# Patient Record
Sex: Male | Born: 1935 | Race: White | Hispanic: No | Marital: Married | Smoking: Never smoker
Health system: Southern US, Community
[De-identification: ages and names within clinical notes are randomized; demographics above are authoritative.]

## PROBLEM LIST (undated history)

## (undated) DIAGNOSIS — I471 Supraventricular tachycardia, unspecified: Secondary | ICD-10-CM

## (undated) DIAGNOSIS — H3530 Unspecified macular degeneration: Secondary | ICD-10-CM

## (undated) DIAGNOSIS — I351 Nonrheumatic aortic (valve) insufficiency: Secondary | ICD-10-CM

## (undated) DIAGNOSIS — I10 Essential (primary) hypertension: Secondary | ICD-10-CM

## (undated) DIAGNOSIS — M1990 Unspecified osteoarthritis, unspecified site: Secondary | ICD-10-CM

## (undated) DIAGNOSIS — C61 Malignant neoplasm of prostate: Secondary | ICD-10-CM

## (undated) HISTORY — DX: Malignant neoplasm of prostate: C61

## (undated) HISTORY — PX: INGUINAL HERNIA REPAIR: SHX194

## (undated) HISTORY — DX: Essential (primary) hypertension: I10

## (undated) HISTORY — PX: PROSTATECTOMY: SHX69

## (undated) HISTORY — PX: OTHER SURGICAL HISTORY: SHX169

## (undated) HISTORY — PX: APPENDECTOMY: SHX54

## (undated) HISTORY — DX: Supraventricular tachycardia, unspecified: I47.10

## (undated) HISTORY — DX: Nonrheumatic aortic (valve) insufficiency: I35.1

## (undated) HISTORY — DX: Unspecified osteoarthritis, unspecified site: M19.90

## (undated) HISTORY — DX: Supraventricular tachycardia: I47.1

## (undated) HISTORY — DX: Unspecified macular degeneration: H35.30

---

## 2004-04-23 ENCOUNTER — Ambulatory Visit (HOSPITAL_COMMUNITY): Admission: RE | Admit: 2004-04-23 | Discharge: 2004-04-23 | Payer: Self-pay | Admitting: Family Medicine

## 2005-02-14 ENCOUNTER — Ambulatory Visit (HOSPITAL_COMMUNITY): Admission: RE | Admit: 2005-02-14 | Discharge: 2005-02-14 | Payer: Self-pay | Admitting: Orthopedic Surgery

## 2006-09-21 ENCOUNTER — Inpatient Hospital Stay (HOSPITAL_COMMUNITY): Admission: RE | Admit: 2006-09-21 | Discharge: 2006-09-24 | Payer: Self-pay | Admitting: Orthopedic Surgery

## 2006-09-25 ENCOUNTER — Encounter: Payer: Self-pay | Admitting: Vascular Surgery

## 2006-09-25 ENCOUNTER — Ambulatory Visit (HOSPITAL_COMMUNITY): Admission: RE | Admit: 2006-09-25 | Discharge: 2006-09-25 | Payer: Self-pay | Admitting: Orthopedic Surgery

## 2006-12-28 ENCOUNTER — Ambulatory Visit (HOSPITAL_COMMUNITY): Admission: RE | Admit: 2006-12-28 | Discharge: 2006-12-28 | Payer: Self-pay | Admitting: Urology

## 2007-03-09 ENCOUNTER — Ambulatory Visit: Admission: RE | Admit: 2007-03-09 | Discharge: 2007-05-26 | Payer: Self-pay | Admitting: Radiation Oncology

## 2007-04-28 ENCOUNTER — Encounter (INDEPENDENT_AMBULATORY_CARE_PROVIDER_SITE_OTHER): Payer: Self-pay | Admitting: Urology

## 2007-04-28 ENCOUNTER — Inpatient Hospital Stay (HOSPITAL_COMMUNITY): Admission: RE | Admit: 2007-04-28 | Discharge: 2007-04-29 | Payer: Self-pay | Admitting: Urology

## 2008-11-08 ENCOUNTER — Ambulatory Visit (HOSPITAL_COMMUNITY): Admission: RE | Admit: 2008-11-08 | Discharge: 2008-11-08 | Payer: Self-pay | Admitting: Orthopedic Surgery

## 2009-05-15 ENCOUNTER — Inpatient Hospital Stay (HOSPITAL_COMMUNITY): Admission: AD | Admit: 2009-05-15 | Discharge: 2009-05-16 | Payer: Self-pay | Admitting: Cardiology

## 2009-05-15 ENCOUNTER — Encounter: Payer: Self-pay | Admitting: Internal Medicine

## 2009-05-18 DIAGNOSIS — C61 Malignant neoplasm of prostate: Secondary | ICD-10-CM

## 2009-05-18 DIAGNOSIS — M109 Gout, unspecified: Secondary | ICD-10-CM

## 2009-05-18 DIAGNOSIS — I10 Essential (primary) hypertension: Secondary | ICD-10-CM

## 2009-05-24 ENCOUNTER — Ambulatory Visit: Payer: Self-pay | Admitting: Internal Medicine

## 2009-05-24 ENCOUNTER — Encounter: Payer: Self-pay | Admitting: Internal Medicine

## 2009-05-24 DIAGNOSIS — I471 Supraventricular tachycardia: Secondary | ICD-10-CM

## 2009-05-31 ENCOUNTER — Telehealth: Payer: Self-pay | Admitting: Internal Medicine

## 2009-06-18 ENCOUNTER — Telehealth: Payer: Self-pay | Admitting: Internal Medicine

## 2009-06-26 ENCOUNTER — Telehealth: Payer: Self-pay | Admitting: Internal Medicine

## 2009-07-30 ENCOUNTER — Ambulatory Visit: Payer: Self-pay | Admitting: Internal Medicine

## 2009-10-01 ENCOUNTER — Telehealth (INDEPENDENT_AMBULATORY_CARE_PROVIDER_SITE_OTHER): Payer: Self-pay | Admitting: *Deleted

## 2009-10-19 ENCOUNTER — Telehealth: Payer: Self-pay | Admitting: Internal Medicine

## 2010-01-17 ENCOUNTER — Ambulatory Visit: Payer: Self-pay | Admitting: Internal Medicine

## 2010-07-25 ENCOUNTER — Ambulatory Visit: Payer: Self-pay | Admitting: Internal Medicine

## 2010-12-10 NOTE — Assessment & Plan Note (Signed)
Summary: 6 month rov/sl   Visit Type:  Follow-up Referring Provider:  Armanda Magic Primary Provider:  Dr.Klein    History of Present Illness: The patient presents today for routine electrophysiology followup. He reports doing very well since last being seen in our clinic. He has had several episodes of "heart racing" lasting 5-10 seconds.  He had 1 episode of abrupt onset tachycardia lasting 15 minutes while helping his daughter move furniture several wks ago.  The patient denies symptoms of chest pain, shortness of breath, orthopnea, PND, lower extremity edema, dizziness, presyncope, syncope, or neurologic sequela. The patient is tolerating medications without difficulties and is otherwise without complaint today.   Current Medications (verified): 1)  Allopurinol 100 Mg Tabs (Allopurinol) .Marland Kitchen.. 1 Tablet Once Daily 2)  Nabumetone 500 Mg Tabs (Nabumetone) .Marland Kitchen.. 1 Tablet Once Daily 3)  Diltiazem Hcl Er Beads 240 Mg Xr24h-Cap (Diltiazem Hcl Er Beads) .... Take One Capsule By Mouth Daily 4)  Aspirin Ec 325 Mg Tbec (Aspirin) .... Take One Tablet By Mouth Daily  Allergies: 1)  ! * No Ivp Dye Allergy 2)  ! * No Shellfish Allergy 3)  ! * No Latex Allergy  Past History:  Past Medical History: Reviewed history from 07/30/2009 and no changes required. prostate cancer status post surgery with subsequent incontinence Hypertension Arthritis Gout Mild aortic insufficiency macular degeneration Glaucoma Hearing loss SVT  Past Surgical History: Reviewed history from 05/24/2009 and no changes required. Bilateral total hip arthroplasty Appendectomy prostatectomy with pelvic lymphadenectomy inguinal herniorrhaphy  Social History: Reviewed history from 05/18/2009 and no changes required. Retired  Married  Tobacco Use - No.  Alcohol Use - yes-2 drinks per day  Vital Signs:  Patient profile:   77 year old male Height:      71 inches Weight:      188 pounds BMI:     26.32 Pulse rate:    48 / minute BP sitting:   112 / 72  (left arm)  Vitals Entered By: Laurance Flatten CMA (January 17, 2010 2:18 PM)  Physical Exam  General:  Well developed, well nourished, in no acute distress. Head:  normocephalic and atraumatic Eyes:  PERRLA/EOM intact; conjunctiva and lids normal. Mouth:  Teeth, gums and palate normal. Oral mucosa normal. Neck:  Neck supple, no JVD. No masses, thyromegaly or abnormal cervical nodes. Lungs:  Clear bilaterally to auscultation and percussion. Heart:  Non-displaced PMI, chest non-tender; regular rate and rhythm, S1, S2 without murmurs, rubs or gallops. Carotid upstroke normal, no bruit. Normal abdominal aortic size, no bruits. Femorals normal pulses, no bruits. Pedals normal pulses. No edema, no varicosities. Abdomen:  Bowel sounds positive; abdomen soft and non-tender without masses, organomegaly, or hernias noted. No hepatosplenomegaly. Msk:  Back normal, normal gait. Muscle strength and tone normal. Pulses:  pulses normal in all 4 extremities Neurologic:  Alert and oriented x 3. Skin:  Intact without lesions or rashes. Psych:  Normal affect.   EKG  Procedure date:  01/17/2010  Findings:      sinus bradycardia 48 bpm, PR 240,  otherwise normal  Impression & Recommendations:  Problem # 1:  PSVT (ICD-427.0) Doing well.   The patient continues to prefer med therapy over ablation. He will contact me should he wish to persue catheter ablation. I think that if he has further prolonged episodes that ablation would be a reasonable options. Continue cardizem vagal maneuvers discussed again today  Patient Instructions: 1)  return in 6 months 2)  contact my office if problems  arise in the interim.

## 2010-12-10 NOTE — Assessment & Plan Note (Signed)
Summary: 6 month rov/sl   Visit Type:  Follow-up Referring Provider:    Primary Provider:      History of Present Illness: The patient presents today for routine electrophysiology followup. He reports doing very well since last being seen in our clinic. He has had several episodes of "heart racing" lasting 5-10 seconds.  He had 1 episode of abrupt onset tachycardia lasting 10 minutes while at rest one wk ago.  The patient denies symptoms of chest pain, shortness of breath, orthopnea, PND, lower extremity edema, dizziness, presyncope, syncope, or neurologic sequela. The patient is tolerating medications without difficulties and is otherwise without complaint today.   Current Medications (verified): 1)  Allopurinol 100 Mg Tabs (Allopurinol) .Marland Kitchen.. 1 Tablet Once Daily 2)  Diltiazem Hcl Er Beads 240 Mg Xr24h-Cap (Diltiazem Hcl Er Beads) .... Take One Capsule By Mouth Daily 3)  Aspirin Ec 325 Mg Tbec (Aspirin) .... Take One Tablet By Mouth Daily  Allergies: 1)  ! * No Ivp Dye Allergy 2)  ! * No Shellfish Allergy 3)  ! * No Latex Allergy  Past History:  Past Medical History: Reviewed history from 07/30/2009 and no changes required. prostate cancer status post surgery with subsequent incontinence Hypertension Arthritis Gout Mild aortic insufficiency macular degeneration Glaucoma Hearing loss SVT  Past Surgical History: Reviewed history from 05/24/2009 and no changes required. Bilateral total hip arthroplasty Appendectomy prostatectomy with pelvic lymphadenectomy inguinal herniorrhaphy  Vital Signs:  Patient profile:   77 year old male Height:      71 inches Weight:      179 pounds BMI:     25.06 Pulse rate:   54 / minute BP sitting:   140 / 70  (left arm)  Vitals Entered By: Laurance Flatten CMA (July 25, 2010 10:58 AM)  Physical Exam  General:  Well developed, well nourished, in no acute distress. Head:  normocephalic and atraumatic Eyes:  PERRLA/EOM intact;  conjunctiva and lids normal. Mouth:  Teeth, gums and palate normal. Oral mucosa normal. Neck:  Neck supple, no JVD. No masses, thyromegaly or abnormal cervical nodes. Lungs:  Clear bilaterally to auscultation and percussion. Heart:  Non-displaced PMI, chest non-tender; regular rate and rhythm, S1, S2 without murmurs, rubs or gallops. Carotid upstroke normal, no bruit. Normal abdominal aortic size, no bruits. Femorals normal pulses, no bruits. Pedals normal pulses. No edema, no varicosities. Abdomen:  Bowel sounds positive; abdomen soft and non-tender without masses, organomegaly, or hernias noted. No hepatosplenomegaly. Msk:  Back normal, normal gait. Muscle strength and tone normal. Pulses:  pulses normal in all 4 extremities Extremities:  No clubbing or cyanosis. Neurologic:  Alert and oriented x 3.   EKG  Procedure date:  07/25/2010  Findings:      sinus bradycardia 54 bpm, PR 220, LAHB  Impression & Recommendations:  Problem # 1:  PSVT (ICD-427.0)  Doing well.   The patient continues to prefer med therapy over ablation. He will contact me should he wish to persue catheter ablation. I think that if he has further prolonged episodes that ablation would be a reasonable options. Continue cardizem vagal maneuvers discussed again today  Problem # 2:  ESSENTIAL HYPERTENSION, BENIGN (ICD-401.1)  Stable, will follow on cardizem. Salt restriction advised  His updated medication list for this problem includes:    Cardizem La 240 Mg Xr24h-tab (Diltiazem hcl coated beads) .Marland Kitchen... 1 by mouth once daily  Patient Instructions: 1)  return in 12 months  Appended Document: 6 month rov/sl   Patient Instructions:  1)  Your physician wants you to follow-up in: 12 months with Dr Jacquiline Doe will receive a reminder letter in the mail two months in advance. If you don't receive a letter, please call our office to schedule the follow-up appointment. Prescriptions: DILTIAZEM HCL ER BEADS 240 MG  XR24H-CAP (DILTIAZEM HCL ER BEADS) Take one capsule by mouth daily  #90 x 3   Entered and Authorized by:   Dennis Bast, RN, BSN   Signed by:   Dennis Bast, RN, BSN on 07/25/2010   Method used:   Faxed to ...       [facility] (mail-order)             , [place]         Ph: [phone]       Fax: [phone]   RxID:   [id number]

## 2010-12-26 ENCOUNTER — Encounter: Payer: Self-pay | Admitting: Internal Medicine

## 2011-02-16 LAB — PROTIME-INR
INR: 1 (ref 0.00–1.49)
Prothrombin Time: 13.6 seconds (ref 11.6–15.2)

## 2011-02-16 LAB — COMPREHENSIVE METABOLIC PANEL
AST: 29 U/L (ref 0–37)
BUN: 30 mg/dL — ABNORMAL HIGH (ref 6–23)
CO2: 29 mEq/L (ref 19–32)
Chloride: 104 mEq/L (ref 96–112)
Creatinine, Ser: 1.29 mg/dL (ref 0.4–1.5)
GFR calc non Af Amer: 54 mL/min — ABNORMAL LOW (ref >60)
Total Bilirubin: 1.3 mg/dL — ABNORMAL HIGH (ref 0.3–1.2)

## 2011-02-16 LAB — LIPID PANEL
HDL: 39 mg/dL — ABNORMAL LOW (ref >39)
VLDL: 8 mg/dL (ref 0–40)

## 2011-02-16 LAB — CBC
HCT: 43.5 % (ref 39.0–52.0)
Hemoglobin: 15 g/dL (ref 13.0–17.0)
MCV: 94.6 fL (ref 78.0–100.0)
RBC: 4.6 MIL/uL (ref 4.22–5.81)
WBC: 7.3 10*3/uL (ref 4.0–10.5)

## 2011-02-16 LAB — CARDIAC PANEL(CRET KIN+CKTOT+MB+TROPI)
CK, MB: 2.4 ng/mL (ref 0.3–4.0)
CK, MB: 2.4 ng/mL (ref 0.3–4.0)
CK, MB: 3.3 ng/mL (ref 0.3–4.0)
Troponin I: 0.05 ng/mL (ref 0.00–0.06)
Troponin I: 0.06 ng/mL (ref 0.00–0.06)
Troponin I: 0.1 ng/mL — ABNORMAL HIGH (ref 0.00–0.06)

## 2011-02-16 LAB — HEPARIN LEVEL (UNFRACTIONATED): Heparin Unfractionated: 0.47 IU/mL (ref 0.30–0.70)

## 2011-03-25 NOTE — Op Note (Signed)
NAMECAEDMON, LOUQUE               ACCOUNT NO.:  [id number]   MEDICAL RECORD NO.:  [id number]          PATIENT TYPE:  INP   LOCATION:  1421                         FACILITY:  [facility]   PHYSICIAN:  Heloise Purpura, MD      DATE OF BIRTH:  February 18, 1932   DATE OF PROCEDURE:  04/28/2007  DATE OF DISCHARGE:                               OPERATIVE REPORT   PREOPERATIVE DIAGNOSIS:  Clinically localized adenocarcinoma of  prostate.   POSTOPERATIVE DIAGNOSIS:  Clinically localized adenocarcinoma of  prostate.   PROCEDURE:  1. Robotic assisted laparoscopic radical prostatectomy (bilateral      nerve sparing).  2. Bilateral pelvic lymphadenectomy.   SURGEON:  Dr. Heloise Purpura.   ASSISTANT:  Dr. Boston Service.   ANESTHESIA:  General.   COMPLICATIONS:  None.   ESTIMATED BLOOD LOSS:  200 mL.   INTRAVENOUS FLUIDS:  1300 mL of lactated Ringer's.   SPECIMENS:  1. Prostate and seminal vesicles.  2. Right pelvic lymph nodes.  3. Left pelvic lymph nodes.   DISPOSITION OF SPECIMENS:  To pathology.   DRAINS:  1. 20-French coude catheter.  2. #19 Blake pelvic drain.   INDICATION:  Mr. Decou is a 77 year old gentleman with clinical  stage T1C prostate cancer with a PSA of 10.3 and Gleason score 3+4 =7.  He underwent a bone scan which demonstrated no evidence of metastatic  disease.  After discussing management options for clinically localized  prostate cancer, the patient elected to proceed with surgical therapy.  Potential risks, complications and alternative options were discussed  with the patient.  Informed consent was obtained.   DESCRIPTION OF PROCEDURE:  The patient was taken to the operating room  and general anesthetic was administered.  He was given preoperative  antibiotics, placed in the dorsal lithotomy position and prepped and  draped in the usual sterile fashion.  During positioning, care was taken  to the avoid excessive traction on the patient's hip due to his  prior  hip replacement surgery.  In addition, he was administered broad-  spectrum IV antibiotics due to his recent hip replacement with Ancef and  ciprofloxacin.  A preoperative time-out was performed.  The Foley  catheter was then inserted into the bladder.  A site was selected just  to the left of midline below the umbilicus for placement of the camera  port.  This was placed using a standard open Hassan technique.  This  allowed entry in the peritoneal cavity under direct vision.  A 12 mm  port was then placed in a pneumoperitoneum was established.  With a 0  degrees lens, the abdomen was inspected.  There was no evidence of any  intra-abdominal injuries or other abnormalities.  The remaining ports  were then placed.  Bilateral 8 mm robotic ports were placed 10 cm  lateral of camera port and just inferior to the camera port.  An  additional 8 mm port was placed in the far left lateral abdominal wall.  A 5 mm port was placed between the camera port and the right robotic  port.  An additional 12 mm port  was placed in the far right lateral  abdominal wall for laparoscopic assistance.  All ports placed under  direct vision without difficulty.  The surgical cart was then docked.  With the aid of cautery scissors, the bladder was reflected posteriorly  allowing entry into the space of Retzius and identification of the  endopelvic fascia and prostate.  The endopelvic fascia was then incised  from the apex back to the base of the prostate bilaterally and the  underlying levator muscle fibers were swept laterally off the prostate.  This isolated the dorsal venous complex which was then stapled and  divided with a 45 mm Flex ETS stapler.  The bladder neck was identified  with the aid of Foley catheter manipulation and was divided anteriorly.  This exposed the Foley catheter.  The catheter balloon was then deflated  and the catheter was brought into the operative field and used to  retract the  prostate anteriorly.  The posterior bladder neck was then  divided and dissection continued between the bladder neck and prostate  until the vasa deferentia and seminal vesicles were identified.  The  vasa deferentia were isolated and divided and then lifted anteriorly.  The seminal vesicles were dissected down to their tips with care to  control the seminal vesicle arterial blood supply and then lifted  anteriorly.  The space between Denonvilliers' fascia and the anterior  rectum was then bluntly developed thereby isolating the vascular  pedicles of the prostate.  The lateral prostatic fascia was then incised  bilaterally allowing the neurovascular bundles to be swept laterally and  posteriorly off the prostate.  The prostatic vascular pedicles were then  ligated with Hem-o-lok clips and divided with sharp cold scissor  dissection.  The nerve neurovascular loss were swept off the apex of the  prostate and urethra and the urethra was sharply divided allowing the  prostate specimen to be disarticulated.  The pelvis was then copiously  irrigated and hemostasis was ensured.  There is noted to be a small  arterial bleeding site off the left neurovascular bundle which was  controlled with a figure-of-eight 3-0 Vicryl suture.  Care was taken to  preserve the neurovascular bundle.  There was no evidence of a rectal  injury.  Attention turned to the right pelvic sidewall.  The fibrofatty  tissue between the external iliac vein, confluence of the iliac vessels,  internal iliac artery, and Cooper's ligament was dissected free from the  pelvic sidewall with care to preserve the obturator nerve.  Hem-o-lok  clips were used for lymphostasis and hemostasis.  This specimen was then  passed off for permanent pathologic analysis.  An identical procedure  was then performed on the contralateral side.  Attention then turned to  the urethral anastomosis.  A 3-0 Vicryl suture was used to  reapproximate Denonvilliers' fascia to the posterior urethral tissue.  A 2-0 Vicryl  slip-knot was then placed at the 6 o'clock position to reapproximate the  bladder neck and urethra.  A double-armed 3-0 Monocryl suture was then  used to perform a 360 degrees running tension-free anastomosis between  the bladder neck and urethra.  A new 20-French coude catheter was  inserted into the bladder and irrigated.  There no blood clots within  the bladder and the anastomosis appeared to be watertight.  A #19 Blake  drain was then brought through the left robotic port and appropriately  positioned in the pelvis.  It was secured to skin with a nylon suture.  The surgical cart was then undocked.  A 0-0 Vicryl suture was then used  to close the right lateral 12 mm port site with the suture passer  device.  The prostate specimen was removed intact within the Endopouch  retrieval bag via the periumbilical port site.  This fascial opening was  closed with a running 0-0 Vicryl suture.  All remaining ports were  removed under direct vision.  All port sites were injected with quarter percent Marcaine and  reapproximated at the skin level with staples.  Sterile dressings were  applied.  The patient appeared to tolerate the procedure well and  without complications.  He was able to be extubated and transferred to  recovery in satisfactory condition.           ______________________________  Heloise Purpura, MD  Electronically Signed     LB/MEDQ  D:  04/28/2007  T:  04/28/2007  Job:  [id number]

## 2011-03-25 NOTE — Discharge Summary (Signed)
NAMESTEEN, BISIG               ACCOUNT NO.:  [id number]   MEDICAL RECORD NO.:  [id number]          PATIENT TYPE:  INP   LOCATION:  1421                         [facility]AC[facility]   PHYSICIAN:  Heloise Purpura, MD      DATE OF BIRTH:  [birth year]   DATE OF ADMISSION:  04/28/2007  DATE OF DISCHARGE:  04/29/2007                               DISCHARGE SUMMARY   ADMISSION DIAGNOSIS:  Prostate cancer.   DISCHARGE DIAGNOSIS:  Prostate cancer.   PROCEDURES:  1. Robotic-assisted laparoscopic radical prostatectomy.  2. Bilateral pelvic lymphadenectomy.   HISTORY AND PHYSICAL:  For full details please see admission history and  physical.  Briefly, Mr. Eakins is a 77 year old gentleman with  clinical stage T1c prostate cancer with a PSA of 10.3 and Gleason score  3 + 4 = 7.  After discussing management options for clinically localized  prostate cancer, he elected to proceed with surgical therapy and the  above procedure.   HOSPITAL COURSE:  The patient was taken to the operating room on April 28, 2007, and underwent a robotic-assisted laparoscopic radical  prostatectomy and bilateral pelvic lymphadenectomy.  He tolerated this  procedure well and without complications.  Postoperatively, he was able  to be transferred to a regular hospital room following recovery from  anesthesia.  He was able to begin ambulating the night of surgery and on  postoperative day #1, remained hemodynamically stable with a stable  hemoglobin of 12.5.  He maintained excellent urine output with minimal  output from his pelvic drain.  His pelvic drain was therefore removed.  He was begun on a clear liquid diet which he tolerated and was  therefore, able to be transitioned to oral pain medication.  By the  afternoon of postoperative day #1, he had met all discharge criteria was  able to be discharged home in excellent condition.   DISPOSITION:  Home.   DISCHARGE MEDICATIONS:  The patient was instructed to resume  all of his  regular home medications excepting any aspirin, nonsteroidal anti-  inflammatory drugs, or herbal supplements.  He was given a prescription  to take Vicodin as needed for pain, told to use Colace as a stool  softener, and given a prescription to begin Cipro 1 day prior his return  visit for Foley catheter removal.   DISCHARGE INSTRUCTIONS:  1. The patient was instructed to be ambulatory but specifically told      to refrain from any heavy lifting, strenuous activity, or driving.  2. He was told to gradually advance his diet once passing flatus.  3. He was also instructed on routine Foley catheter care.   FOLLOWUP:  Mr. Bracco will follow up in 1 week for removal of his  Foley catheter and to discuss his surgical pathology in detail.           ______________________________  Heloise Purpura, MD  Electronically Signed     LB/MEDQ  D:  04/29/2007  T:  04/29/2007  Job:  [id number]

## 2011-03-25 NOTE — H&P (Signed)
[name], [name]               ACCOUNT NO.:  [id number]   MEDICAL RECORD NO.:  [id number]          PATIENT TYPE:  INP   LOCATION:  X003                         [facility]AC[facility]  [facility]   PHYSICIAN:  Heloise Purpura, MD      DATE OF BIRTH:  21-Aug-1932   DATE OF ADMISSION:  04/28/2007  DATE OF DISCHARGE:                              HISTORY & PHYSICAL   CHIEF COMPLAINT:  Prostate cancer.   HISTORY:  Mr. Boorman is a 77 year old gentleman with clinical stage  T1C prostate cancer with a PSA of 10.3 and Gleason score 3+4 equals 7.  He was initially managed with active surveillance when he was initially  diagnosed and 2006 with a small volume Gleason 6 cancer.  He did  demonstrate progression an on repeat biopsy had the above parameters.  He underwent a bone scan which was negative for metastatic disease.  After discussing management options for clinically localized prostate  cancer, the patient elected to proceed with surgical therapy.  Due to  his history of mild aortic insufficiency, the patient did see Dr. Armanda Magic for cardiac clearance and was felt to be a low risk for surgery.  He was instructed to continue his beta blocker.   PAST MEDICAL HISTORY:  1. Hypertension.  2. Arthritis.  3. Gout.  4. Mild aortic insufficiency.   PAST SURGICAL HISTORY:  1. Bilateral total hip arthroplasty.  2. Appendectomy.   MEDICATIONS:  1. Aspirin.  2. Allopurinol.  3. Toprol.   ALLERGIES:  No known drug allergies.   FAMILY HISTORY:  No history of prostate cancer or GU malignancy.   SOCIAL HISTORY:  The patient is retired.  He is married.  He denies  tobacco use.  He drinks approximately two glasses of alcohol per day.   REVIEW OF SYSTEMS:  Complete Review of Systems was performed.  Pertinent  positives include blurred vision and joint pain.   PHYSICAL EXAMINATION:  CONSTITUTIONAL:  Well-nourished, well-developed,  age-appropriate male in no acute distress.  CARDIOVASCULAR:  Regular  rate and rhythm.  LUNGS:  Clear bilaterally.  RECTAL:  No nodularity or induration.   IMPRESSION:  Clinically localized prostate cancer.   PLAN:  Mr. Pustejovsky will undergo robotic-assisted laparoscopic radical  prostatectomy and bilateral pelvic lymphadenectomy.  He will then be  admitted to the hospital for routine postoperative care.           ______________________________  Heloise Purpura, MD  Electronically Signed     LB/MEDQ  D:  04/28/2007  T:  04/28/2007  Job:  [id number]

## 2011-03-28 NOTE — Op Note (Signed)
NAMETYKE, OUTMAN               ACCOUNT NO.:  [id number]   MEDICAL RECORD NO.:  [id number]          PATIENT TYPE:  INP   LOCATION:  2899                         FACILITY:  [facility]   PHYSICIAN:  Feliberto Gottron. Turner Daniels, M.D.   DATE OF BIRTH:  Aug 10, 1932   DATE OF PROCEDURE:  09/21/2006  DATE OF DISCHARGE:                                 OPERATIVE REPORT   PREOPERATIVE DIAGNOSIS:  End-stage arthritis left hip.   POSTOPERATIVE DIAGNOSIS:  End-stage arthritis left hip.   PROCEDURE:  Left total hip arthroplasty using DePuy 54-mm ASR cup, NK+0 47  mm ultimate head, 18 x 13 x 150 x-ray 42 SROM stem, 18 18 F small cone.   SURGEON:  Feliberto Gottron. Turner Daniels, M.D.   ASSISTANT:  Skip Mayer PA-C.   ANESTHETIC:  General endotracheal.   ESTIMATED BLOOD LOSS:  700 mL.   FLUID REPLACEMENT:  1500 mL crystalloid.   DRAINS PLACED:  Foley catheter.   URINE OUTPUT:  400 mL.   INDICATIONS FOR PROCEDURE:  A 77 year old man who has had a total hip and a  revision total hip on the right side done by other physicians.  Was end-  stage arthritis of the left hip and desires elective left total hip  arthroplasty.  He is well aware of the risks and benefits of surgery and has  failed conservative treatment.   DESCRIPTION OF PROCEDURE:  The patient was identified by armband, taken to  the operating room at [facility]  Appropriate anesthetic monitors  were attached and general endotracheal anesthesia induced with the patient  in the supine position.  Foley catheter inserted.  He was then placed in the  right lateral decubitus position and fixed there with a Veronda Prude II  pelvic clamp and the left lower extremity prepped and draped in the usual  sterile fashion from the ankle to the hemipelvis.  Skin along the lateral  hip and thigh infiltrated with 10 mL of half percent Marcaine and  epinephrine solution.  A 14 cm incision centered over the greater trochanter  allowing a posterolateral approach to the  hip was then made through the skin  and subcutaneous tissue down to the level of the tensor fascia lata.  An IT  band which was cut in line with the skin incision exposing the greater  trochanter.  Cobra retractor was then placed between the superior hip joint  capsule and the gluteus minimus and a second between the quadratus femoris  and the inferior hip joint capsule.  The short external rotators were  isolated and cut off their insertion on either trochanteric crest and tagged  with a #2 Ethilon suture, allowing [place] to develop an acetabular-based  capsular flap going posterior-superior to posterior-inferior.  This was also  tagged with two #2 Ethibond sutures.  The hip was then flexed and  internally rotated, dislocating the joint and exposing the arthritic femoral  head which was then cut off one fingerbreadth above the lesser trochanter.  The hip was then placed in neutral rotation and then translocated anteriorly  proximally using a Homan retractor, levering off  the anterior column.  A  spiked Hohmann was placed in the obturator foramen inferiorly and posterior-  superior and posterior-inferior wing retractors were placed allowing good  exposure of the acetabulum.  __________ was then performed with the  electrocautery and we sequentially reamed the acetabulum up to a 53 mm  basket reamer obtaining good coverage in all quadrants and irrigated out the  acetabulum with normal saline solution.  A 54 mm ASR shell was then loaded  on the inserter and hammered into place in 45 degrees of abduction and 20  degrees of anteversion, obtaining good fit and fill.   At this point, the hip was flexed and internally rotated exposing the  proximal femur which was entered with a box cutting chisel followed by the  initiator and followed by axial reaming up to a 13.5-mm axial reamer to the  appropriate depth for the SROM system.  We then reamed the proximal femur up  to an 35 F cone followed by calcar  milling to a small calcar and inserted an  18 F small cone followed by the 18 trial stem with a 42 base neck, NK+0 47  mm trial head.  The hip was then reduced, could not be dislocated anteriorly  in external rotation and required 90 of flexion and almost 90 of internal  rotation before it became unstable posteriorly.  At this point, the trial  components were removed.  The proximal femur was irrigated out with normal  saline solution and we hammered into place an 58 F small cone followed by an  18 x 150 x 13 x 42 stem in the same version as the calcar.  An NK+0 47 mm  ultimate ball was then hammered onto the stem.  The hip again reduced and  checked for stability and found to be excellent.  The wound was irrigated  out with normal saline solution.  The short external rotators were repaired  back to the intertrochanteric crest through drill holes.  The IT band closed  with running #1 Vicryl suture.  The subcutaneous tissue with zero and 2-0  undyed Vicryl suture and the skin with running interlocking 3-0 nylon.  A  dressing of Xeroform, 4x4 dressing sponges and Hypafix tape applied.  The  patient was then unclamped, laid supine, awakened and taken recovery room  without difficulty.      Feliberto Gottron. Turner Daniels, M.D.  Electronically Signed     FJR/MEDQ  D:  09/21/2006  T:  09/21/2006  Job:  [id number]

## 2011-03-28 NOTE — Discharge Summary (Signed)
NAMEAVIER, [name]               ACCOUNT NO.:  [id number]   MEDICAL RECORD NO.:  [id number]          PATIENT TYPE:  INP   LOCATION:  5028                         FACILITY:  [facility]   PHYSICIAN:  Feliberto Gottron. Turner Daniels, M.D.   DATE OF BIRTH:  [birth year]   DATE OF ADMISSION:  09/21/2006  DATE OF DISCHARGE:  09/24/2006                               DISCHARGE SUMMARY   PRIMARY DIAGNOSIS FOR THIS ADMISSION:  End-stage degenerative joint  disease of the left hip.   PROCEDURE WHILE IN HOSPITAL:  Left total hip arthroplasty.   DISCHARGE SUMMARY:  Patient is a 77 year old man who has had a total hip  revision and total hip on the right side done by the physicians.  He has  had end-stage arthritis of the left hip for several years and desires  elective total left hip arthroplasty.  He is well aware of the risks  versus benefits having failed conservative treatment.   ALLERGIES:  NO KNOWN DRUG ALLERGIES   MEDICATIONS AT TIME OF ADMISSION:  Aspirin, allopurinol, and metoprolol.   PAST HISTORY:  1. Heart palpitations.  2. Increased blood pressure.  3. DJD.  4. Gout.   SURGICAL HISTORY:  1. Right total hip arthroplasty in 1988.  2. Right total hip revision in 2006.  3. Appendectomy in 1945, no difficulties with [facility]EC.   SOCIAL HISTORY:  No tobacco, positive ethanol occasionally.  No IV drug  use, he is married and retired.   FAMILY HISTORY:  Mother died at the age of 21 due to cancer.  Father  died at 80, also cancer.   REVIEW OF SYSTEMS:  __________ with normal heartbeat and calf cramps.  No history of shortness of breath, chest pain, or recent illness.   EXAM:  VITAL SIGNS:  Patient's temperature is 98.5, pulse 72,  respirations 16, blood pressure 135/85, approximately a 190 pound male.  HEENT:  Head is normocephalic, atraumatic.  Ears:  TMs clear.  Eyes:  Pupils equal, round, reactive to light and accommodation.  Nose:  Patent.  Throat benign.  NECK:  Supple.  Full range of motion.  CHEST:  Clear to auscultation.  HEART:  Regular rate and rhythm.  ABDOMEN:  Soft, nontender.  EXTREMITIES:  Right hip has full range of motion.  Left hip, positive  pain with range of motion and internal and external rotation 5 degrees.  Left ankle is swollen with positive pain.  SKIN:  Showed a well-healed scar of right tip.   LABORATORY DATA:  X-rays show end-stage DJD changes of the left hip.   Preoperative labs, including CBC, CMT, chest x-ray, EKG, PT and PTT were  all within normal limits with the exception of a WBC of 11, glucose of  106, bilirubin of 1.5.   HOSPITAL COURSE:  On the day of admission, the patient was taken to the  operating room at [facility] where he underwent a left total  hip arthroplasty using S-ROM components, 18 x 13 x 150 x 42 S-ROM stem,  with an 18-F small cone, NK +0 47-mm ultima head and a 54-mm ASR cup. A  Foley catheter was placed preoperatively.  The patient was placed on  preoperative antibiotics.  He was placed on postoperative Coumadin  prophylaxis with Lovenox to bridge until he became therapeutic.   Postoperative day 1:  The patient was awake and alert, trace nausea, no  actual emesis, vital signs are stable, he is afebrile.  Urine output  1000 cc, PT of 13.2, hemoglobin 10.4.  He will maintain weightbearing is  tolerated, ambulation still had precautions.   Postoperative day 2:  The patient was awake and alert, in moderate pain,  no nausea or vomiting, vital signs are stable, he was afebrile.  Dressing was dry, neurovascular intact.  Hemoglobin 10.2, PT 16.2,  otherwise stable and making good progress with physical therapy.   Postoperative day 3:  Patient was without complaint.  He was afebrile.  Hemoglobin 9.3, INR 1.4.  He was discharged home after passing physical  therapy goals.   Medication at time of discharge include Tylox,  __________ protocol [facility] 2 weeks postoperatively and target INR of 1.5 to   2, Robaxin as needed for spasm, resume home meds as per home med drug  sheet, dressing changes daily, diet is regular, activity weightbearing  as tolerated with total hip precautions.  Return to clinic in 1 week's  time per Dr. Turner Daniels for a followup check.      [name]. [name].      Feliberto Gottron. Turner Daniels, M.D.  Electronically Signed    JBR/MEDQ  D:  10/19/2006  T:  10/20/2006  Job:  [id number]

## 2011-06-30 ENCOUNTER — Encounter: Payer: Self-pay | Admitting: Internal Medicine

## 2011-07-21 ENCOUNTER — Ambulatory Visit (INDEPENDENT_AMBULATORY_CARE_PROVIDER_SITE_OTHER): Payer: Self-pay | Admitting: Internal Medicine

## 2011-07-21 ENCOUNTER — Encounter: Payer: Self-pay | Admitting: Internal Medicine

## 2011-07-21 VITALS — BP 122/66 | HR 50 | Ht 71.0 in | Wt 186.8 lb

## 2011-07-21 DIAGNOSIS — I471 Supraventricular tachycardia: Secondary | ICD-10-CM

## 2011-07-21 DIAGNOSIS — I10 Essential (primary) hypertension: Secondary | ICD-10-CM

## 2011-07-21 NOTE — Assessment & Plan Note (Signed)
Controlled with diltiazem No changes

## 2011-07-21 NOTE — Assessment & Plan Note (Signed)
Stable No change required today  

## 2011-07-21 NOTE — Progress Notes (Signed)
The patient presents today for routine electrophysiology followup.  Since last being seen in our clinic, the patient reports doing very well.  He remains very active.  He has had no SVT over the past year. Today, he denies symptoms of palpitations, chest pain, shortness of breath, orthopnea, PND, lower extremity edema, dizziness, presyncope, syncope, or neurologic sequela.  The patient feels that he is tolerating medications without difficulties and is otherwise without complaint today.   Past Medical History  Diagnosis Date  . Prostate cancer     s/p surgery with subsequent incontinence  . HTN (hypertension)   . Arthritis   . Gout   . Aortic insufficiency     mild  . Macular degeneration   . Glaucoma   . Hearing loss   . SVT (supraventricular tachycardia)    Past Surgical History  Procedure Date  . Bilateral total hip replacement   . Appendectomy   . Prostatectomy     with pelvic lymphadenectomy  . Inguinal hernia repair     Current Outpatient Prescriptions  Medication Sig Dispense Refill  . allopurinol (ZYLOPRIM) 100 MG tablet Take 100 mg by mouth daily.        Marland Kitchen aspirin EC 325 MG tablet Take 325 mg by mouth daily.        . diclofenac (VOLTAREN) 75 MG EC tablet Take 75 mg by mouth 2 (two) times daily.        Marland Kitchen diltiazem (TIAZAC) 240 MG 24 hr capsule Take 240 mg by mouth daily.          Allergies no known allergies  History   Social History  . Marital Status: Married    Spouse Name: N/A    Number of Children: N/A  . Years of Education: N/A   Occupational History  . Retired    Social History Main Topics  . Smoking status: Never Smoker   . Smokeless tobacco: Not on file  . Alcohol Use: Yes     2 drinks per day  . Drug Use: Not on file  . Sexually Active: Not on file   Other Topics Concern  . Not on file   Social History Narrative  . No narrative on file    Family History  Problem Relation Age of Onset  . Cancer Mother   . Cancer Father    Physical  Exam: Filed Vitals:   07/21/11 1117  BP: 122/66  Pulse: 50  Height: 5\' 11"  (1.803 m)  Weight: 186 lb 12.8 oz (84.732 kg)    GEN- The patient is well appearing, alert and oriented x 3 today.   Head- normocephalic, atraumatic Eyes-  Sclera clear, conjunctiva pink Ears- hearing intact Oropharynx- clear Neck- supple, no JVP Lymph- no cervical lymphadenopathy Lungs- Clear to ausculation bilaterally, normal work of breathing Heart- Regular rate and rhythm, no murmurs, rubs or gallops, PMI not laterally displaced GI- soft, NT, ND, + BS Extremities- no clubbing, cyanosis, or edema MS- no significant deformity or atrophy Skin- no rash or lesion Psych- euthymic mood, full affect Neuro- strength and sensation are intact  ekg today reveals sinus bradycardia 50 bpm, PR 218, LVH, LAHB  Assessment and Plan:

## 2011-07-21 NOTE — Patient Instructions (Signed)
Your physician wants you to follow-up in: 12 months with Dr Allred You will receive a reminder letter in the mail two months in advance. If you don't receive a letter, please call our office to schedule the follow-up appointment.  

## 2011-08-03 ENCOUNTER — Other Ambulatory Visit: Payer: Self-pay | Admitting: Internal Medicine

## 2011-08-27 LAB — POTASSIUM: Potassium: 4

## 2011-08-27 LAB — HEMOGLOBIN AND HEMATOCRIT, BLOOD
HCT: 36.5 — ABNORMAL LOW
HCT: 37.5 — ABNORMAL LOW
Hemoglobin: 12.5 — ABNORMAL LOW
Hemoglobin: 12.5 — ABNORMAL LOW

## 2011-08-27 LAB — TYPE AND SCREEN
ABO/RH(D): A POS
Antibody Screen: NEGATIVE

## 2011-08-27 LAB — ABO/RH: ABO/RH(D): A POS

## 2011-08-28 LAB — BASIC METABOLIC PANEL
CO2: 29
Calcium: 9.8
Chloride: 106
Creatinine, Ser: 1.1
GFR calc Af Amer: >60
Sodium: 142

## 2011-08-28 LAB — CBC
Hemoglobin: 14
MCHC: 33.6
MCV: 95.5
RBC: 4.38
WBC: 9.5

## 2012-04-28 ENCOUNTER — Other Ambulatory Visit: Payer: Self-pay | Admitting: *Deleted

## 2012-04-28 MED ORDER — DILTIAZEM HCL ER BEADS 240 MG PO CP24: 240.0000 mg | ORAL_CAPSULE | Freq: Every day | ORAL | Status: DC

## 2012-08-09 ENCOUNTER — Ambulatory Visit (INDEPENDENT_AMBULATORY_CARE_PROVIDER_SITE_OTHER): Payer: MEDICARE | Admitting: Internal Medicine

## 2012-08-09 ENCOUNTER — Encounter: Payer: Self-pay | Admitting: Internal Medicine

## 2012-08-09 VITALS — BP 134/73 | HR 53 | Ht 71.0 in | Wt 187.0 lb

## 2012-08-09 DIAGNOSIS — I471 Supraventricular tachycardia: Secondary | ICD-10-CM

## 2012-08-09 NOTE — Progress Notes (Signed)
PCP: Catha Gosselin, MD Primary Cardiologist: [name] is a 78 y.o. male who presents today for routine electrophysiology followup.  Since last being seen in our clinic, the patient reports doing very well.  He has had no SVT in over 2 years.  Today, he denies symptoms of palpitations, chest pain, shortness of breath,  lower extremity edema, dizziness, presyncope, or syncope.  He remains very active and continues to do yard work without difficulty.  The patient is otherwise without complaint today.   Past Medical History  Diagnosis Date  . Prostate cancer     s/p surgery with subsequent incontinence  . HTN (hypertension)   . Arthritis   . Gout   . Aortic insufficiency     mild  . Macular degeneration   . Glaucoma   . Hearing loss   . SVT (supraventricular tachycardia)    Past Surgical History  Procedure Date  . Bilateral total hip replacement   . Appendectomy   . Prostatectomy     with pelvic lymphadenectomy  . Inguinal hernia repair     Current Outpatient Prescriptions  Medication Sig Dispense Refill  . allopurinol (ZYLOPRIM) 100 MG tablet Take 100 mg by mouth daily.        Marland Kitchen aspirin EC 325 MG tablet Take 325 mg by mouth daily.        . diclofenac (VOLTAREN) 75 MG EC tablet Take 75 mg by mouth 2 (two) times daily.        Marland Kitchen diltiazem (TIAZAC) 240 MG 24 hr capsule Take 1 capsule (240 mg total) by mouth daily.  90 capsule  2    Physical Exam: Filed Vitals:   08/09/12 1000  BP: 134/73  Pulse: 53  Height: 5\' 11"  (1.803 m)  Weight: 187 lb (84.823 kg)  SpO2: 100%    GEN- The patient is well appearing, alert and oriented x 3 today.   Head- normocephalic, atraumatic Eyes-  Sclera clear, conjunctiva pink Ears- hearing intact Oropharynx- clear Lungs- Clear to ausculation bilaterally, normal work of breathing Heart- Regular rate and rhythm, no murmurs, rubs or gallops, PMI not laterally displaced GI- soft, NT, ND, + BS Extremities- no clubbing,  cyanosis, or edema  ekg today reveals sinus rhythm 53 bpm, PR 200 msec, LVH  Assessment and Plan:

## 2012-08-09 NOTE — Assessment & Plan Note (Signed)
No arrhythmias in over two years Continue diltiazem No changes  Return in 12 months

## 2012-08-09 NOTE — Patient Instructions (Addendum)
Your physician wants you to follow-up in: 12 months with Dr Allred You will receive a reminder letter in the mail two months in advance. If you don't receive a letter, please call our office to schedule the follow-up appointment.  

## 2013-02-07 ENCOUNTER — Other Ambulatory Visit: Payer: Self-pay | Admitting: *Deleted

## 2013-02-07 ENCOUNTER — Other Ambulatory Visit: Payer: Self-pay | Admitting: Physical Medicine and Rehabilitation

## 2013-02-07 DIAGNOSIS — M48061 Spinal stenosis, lumbar region without neurogenic claudication: Secondary | ICD-10-CM

## 2013-02-07 DIAGNOSIS — M545 Low back pain: Secondary | ICD-10-CM

## 2013-02-07 MED ORDER — DILTIAZEM HCL ER BEADS 240 MG PO CP24: 240.0000 mg | ORAL_CAPSULE | Freq: Every day | ORAL | Status: DC

## 2013-02-12 ENCOUNTER — Ambulatory Visit
Admission: RE | Admit: 2013-02-12 | Discharge: 2013-02-12 | Disposition: A | Discharge status: Home / Self Care | Payer: Medicare Other | Source: Ambulatory Visit | Attending: Physical Medicine and Rehabilitation | Admitting: Physical Medicine and Rehabilitation

## 2013-02-12 DIAGNOSIS — M48061 Spinal stenosis, lumbar region without neurogenic claudication: Secondary | ICD-10-CM

## 2013-02-12 DIAGNOSIS — M545 Low back pain: Secondary | ICD-10-CM

## 2013-02-23 ENCOUNTER — Emergency Department (HOSPITAL_COMMUNITY)
Admission: EM | Admit: 2013-02-23 | Discharge: 2013-02-23 | Disposition: A | Discharge status: Home / Self Care | Payer: Medicare Other | Attending: Emergency Medicine | Admitting: Emergency Medicine

## 2013-02-23 ENCOUNTER — Emergency Department (HOSPITAL_COMMUNITY): Payer: Medicare Other

## 2013-02-23 ENCOUNTER — Encounter (HOSPITAL_COMMUNITY): Payer: Self-pay | Admitting: *Deleted

## 2013-02-23 DIAGNOSIS — Z8679 Personal history of other diseases of the circulatory system: Secondary | ICD-10-CM | POA: Insufficient documentation

## 2013-02-23 DIAGNOSIS — M129 Arthropathy, unspecified: Secondary | ICD-10-CM | POA: Insufficient documentation

## 2013-02-23 DIAGNOSIS — N200 Calculus of kidney: Secondary | ICD-10-CM | POA: Insufficient documentation

## 2013-02-23 DIAGNOSIS — Z8546 Personal history of malignant neoplasm of prostate: Secondary | ICD-10-CM | POA: Insufficient documentation

## 2013-02-23 DIAGNOSIS — Z79899 Other long term (current) drug therapy: Secondary | ICD-10-CM | POA: Insufficient documentation

## 2013-02-23 DIAGNOSIS — H9190 Unspecified hearing loss, unspecified ear: Secondary | ICD-10-CM | POA: Insufficient documentation

## 2013-02-23 DIAGNOSIS — Z7982 Long term (current) use of aspirin: Secondary | ICD-10-CM | POA: Insufficient documentation

## 2013-02-23 DIAGNOSIS — Z8669 Personal history of other diseases of the nervous system and sense organs: Secondary | ICD-10-CM | POA: Insufficient documentation

## 2013-02-23 DIAGNOSIS — Z96649 Presence of unspecified artificial hip joint: Secondary | ICD-10-CM | POA: Insufficient documentation

## 2013-02-23 DIAGNOSIS — M109 Gout, unspecified: Secondary | ICD-10-CM | POA: Insufficient documentation

## 2013-02-23 DIAGNOSIS — I10 Essential (primary) hypertension: Secondary | ICD-10-CM | POA: Insufficient documentation

## 2013-02-23 LAB — BASIC METABOLIC PANEL
CO2: 26 mEq/L (ref 19–32)
Chloride: 103 mEq/L (ref 96–112)
GFR calc non Af Amer: 58 mL/min — ABNORMAL LOW (ref >90)
Glucose, Bld: 118 mg/dL — ABNORMAL HIGH (ref 70–99)
Potassium: 4.4 mEq/L (ref 3.5–5.1)
Sodium: 137 mEq/L (ref 135–145)

## 2013-02-23 LAB — CBC WITH DIFFERENTIAL/PLATELET
Eosinophils Absolute: 0.2 10*3/uL (ref 0.0–0.7)
Hemoglobin: 13.6 g/dL (ref 13.0–17.0)
Lymphocytes Relative: 11 % — ABNORMAL LOW (ref 12–46)
Lymphs Abs: 0.9 10*3/uL (ref 0.7–4.0)
Monocytes Relative: 7 % (ref 3–12)
Neutro Abs: 6.7 10*3/uL (ref 1.7–7.7)
Neutrophils Relative %: 80 % — ABNORMAL HIGH (ref 43–77)
Platelets: 237 10*3/uL (ref 150–400)
RBC: 4.36 MIL/uL (ref 4.22–5.81)
WBC: 8.4 10*3/uL (ref 4.0–10.5)

## 2013-02-23 LAB — URINE MICROSCOPIC-ADD ON

## 2013-02-23 LAB — URINALYSIS, ROUTINE W REFLEX MICROSCOPIC
Glucose, UA: NEGATIVE mg/dL
Leukocytes, UA: NEGATIVE
pH: 5.5 (ref 5.0–8.0)

## 2013-02-23 MED ORDER — ONDANSETRON HCL 4 MG PO TABS: 4.0000 mg | ORAL_TABLET | Freq: Three times a day (TID) | ORAL | Status: DC | PRN

## 2013-02-23 MED ORDER — HYDROCODONE-ACETAMINOPHEN 5-325 MG PO TABS: ORAL_TABLET | ORAL | Status: DC

## 2013-02-23 NOTE — ED Notes (Signed)
Pt states this evening after dinner started having L sided flank pain, went to bed and awoke w/ pain worse, states also feels like he needs to urinate but cannot, states does have incontinence.

## 2013-02-23 NOTE — ED Provider Notes (Signed)
History     CSN: [id number]  Arrival date & time 02/23/13  0059   First MD Initiated Contact with Patient 02/23/13 0120      Chief Complaint  Patient presents with  . Flank Pain    (Consider location/radiation/quality/duration/timing/severity/associated sxs/prior treatment) Patient is a 79 y.o. male presenting with flank pain.  Flank Pain   79 year old male presents to emergency room with complaint of left flank pain and the feeling that he needs to urinate.  He reports he has a history of back pain, had been on NSAIDs in the past.  NSAIDs were DC'd by his PCP who was worried about kidney damage.  He reports he now has every day, low-grade back pain.  Tonight, around 11:00, he had a different sort of back pain, more severe than his normal.  He reports he has had some radiation of pain down into his left groin.  Patient reports he has a history of prostate cancer, after surgery has had persistent incontinence.  He normally does not have the urge to urinate, but since the onset of this left flank pain, has had several episodes where he feels the urge to go.  He denies seeing any blood in his urine.  He reports he's otherwise fairly healthy.  No prior history of kidney stones.  He is followed [facility] for his prostate cancer.  Past Medical History  Diagnosis Date  . Prostate cancer     s/p surgery with subsequent incontinence  . HTN (hypertension)   . Arthritis   . Gout   . Aortic insufficiency     mild  . Macular degeneration   . Glaucoma(365)   . Hearing loss   . SVT (supraventricular tachycardia)     Past Surgical History  Procedure Laterality Date  . Bilateral total hip replacement    . Appendectomy    . Prostatectomy      with pelvic lymphadenectomy  . Inguinal hernia repair      Family History  Problem Relation Age of Onset  . Cancer Mother   . Cancer Father     History  Substance Use Topics  . Smoking status: Never Smoker   . Smokeless tobacco: Not on  file  . Alcohol Use: Yes     Comment: 2 drinks per day      Review of Systems  Genitourinary: Positive for flank pain.   See History of Present Illness; otherwise all other systems are reviewed and negative  Allergies  Review of patient's allergies indicates no known allergies.  Home Medications   Current Outpatient Rx  Name  Route  Sig  Dispense  Refill  . allopurinol (ZYLOPRIM) 100 MG tablet   Oral   Take 100 mg by mouth daily.           Marland Kitchen aspirin EC 325 MG tablet   Oral   Take 325 mg by mouth daily.           . diclofenac (VOLTAREN) 75 MG EC tablet   Oral   Take 75 mg by mouth 2 (two) times daily.           Marland Kitchen diltiazem (TIAZAC) 240 MG 24 hr capsule   Oral   Take 1 capsule (240 mg total) by mouth daily.   90 capsule   2     BP 147/72  Pulse 60  Temp(Src) 97.9 F (36.6 C) (Oral)  Resp 18  SpO2 94%  Physical Exam  Nursing note and vitals  reviewed. Constitutional: He is oriented to person, place, and time. He appears well-developed and well-nourished.  HENT:  Head: Normocephalic and atraumatic.  Nose: Nose normal.  Mouth/Throat: Oropharynx is clear and moist.  Eyes: Conjunctivae and EOM are normal. Pupils are equal, round, and reactive to light.  Neck: Normal range of motion. Neck supple. No JVD present. No tracheal deviation present. No thyromegaly present.  Cardiovascular: Normal rate, regular rhythm, normal heart sounds and intact distal pulses.  Exam reveals no gallop and no friction rub.   No murmur heard. Pulmonary/Chest: Effort normal and breath sounds normal. No stridor. No respiratory distress. He has no wheezes. He has no rales. He exhibits no tenderness.  Abdominal: Soft. Bowel sounds are normal. He exhibits no distension and no mass. There is no tenderness. There is no rebound and no guarding.  Musculoskeletal: Normal range of motion. He exhibits no edema and no tenderness.  Lymphadenopathy:    He has no cervical adenopathy.  Neurological:  He is alert and oriented to person, place, and time. He exhibits normal muscle tone. Coordination normal.  Skin: Skin is warm and dry. No rash noted. No erythema. No pallor.  Psychiatric: He has a normal mood and affect. His behavior is normal. Judgment and thought content normal.    ED Course  Procedures (including critical care time)  Labs Reviewed  URINALYSIS, ROUTINE W REFLEX MICROSCOPIC - Abnormal; Notable for the following:    Hgb urine dipstick MODERATE (*)    All other components within normal limits  CBC WITH DIFFERENTIAL - Abnormal; Notable for the following:    Neutrophils Relative 80 (*)    Lymphocytes Relative 11 (*)    All other components within normal limits  BASIC METABOLIC PANEL - Abnormal; Notable for the following:    Glucose, Bld 118 (*)    BUN 29 (*)    GFR calc non Af Amer 58 (*)    GFR calc Af Amer 67 (*)    All other components within normal limits  URINE MICROSCOPIC-ADD ON   Ct Abdomen Pelvis Wo Contrast  02/23/2013  *RADIOLOGY REPORT*  Clinical Data: Left flank pain and hematuria.  CT ABDOMEN AND PELVIS WITHOUT CONTRAST  Technique:  Multidetector CT imaging of the abdomen and pelvis was performed following the standard protocol without intravenous contrast.  Comparison: MRI of the lumbar spine performed 02/12/2013, and CT of the pelvis performed 02/14/2005  Findings: The visualized lung bases are clear.  A small 0.9 cm hypodensity within the right hepatic lobe is nonspecific may reflect a small cyst in the liver is otherwise unremarkable in appearance.  The spleen is within normal limits. The gallbladder is relatively decompressed and grossly unremarkable.  The pancreas and adrenal glands are within normal limits.  There is mild to moderate left-sided hydronephrosis, with diffuse prominence of the left ureter along its entire course, and an obstructing 1.2 x 0.6 cm stone noted at the distal left ureter, just proximal to the left vesicoureteral junction.  The  significant perinephric stranding and fluid is noted on the left side.  Mild nonspecific right-sided perinephric stranding is seen.  Scattered tiny nonobstructing bilateral renal stones are noted, better characterized on coronal images.  No free fluid is identified.  The small bowel is unremarkable in appearance.  The stomach is within normal limits.  No acute vascular abnormalities are seen.  Diffuse calcification is noted along the abdominal aorta and its branches, including at the origins of both renal arteries.  The patient is status post  appendectomy.  Diffuse diverticulosis is noted along the sigmoid colon; this is not well assessed due to artifact from the patient's bilateral hip prostheses.  The colon is otherwise grossly unremarkable in appearance.  The bladder is largely decompressed and not well assessed due to metal artifact.  The prostate is also not well seen, though it appears mildly enlarged, measuring approximately 5.1 cm in transverse dimension.  No inguinal lymphadenopathy is seen.  No acute osseous abnormalities are identified.  Mild endplate irregularity and disc space narrowing are noted at L2-L3 and L4-L5, with mild vacuum phenomenon.  IMPRESSION:  1.  Mild to moderate left-sided hydronephrosis, with diffuse prominence of the left ureter, and an obstructing 1.2 x 0.6 cm stone at the distal left ureter, just proximal to the left vesicoureteral junction.  Significant left-sided perinephric stranding and fluid seen. 2.  Scattered tiny nonobstructing bilateral renal stones noted. 3.  Diffuse calcification along the abdominal aorta and its branches, including at the origins of the renal arteries. 4.  Diffuse diverticulosis along the sigmoid colon, not well assessed due to artifact from the patient's hip prostheses. 5.  Mildly enlarged prostate. 6.  Likely small hepatic cyst noted.   Original Report Authenticated By: Tonia Ghent, M.D.      1. Kidney stone on left side       MDM   79 year old male with left flank.  Pain.  He has 710 red blood cells, and is, UA.  Suspect kidney stone.  To have CT abdomen pelvis without        Olivia Mackie, MD 02/23/13 [phone]

## 2013-05-16 ENCOUNTER — Other Ambulatory Visit (HOSPITAL_COMMUNITY): Payer: Self-pay | Admitting: Family Medicine

## 2013-05-16 DIAGNOSIS — R5383 Other fatigue: Secondary | ICD-10-CM

## 2013-05-16 DIAGNOSIS — C61 Malignant neoplasm of prostate: Secondary | ICD-10-CM

## 2013-05-16 DIAGNOSIS — R634 Abnormal weight loss: Secondary | ICD-10-CM

## 2013-05-23 ENCOUNTER — Ambulatory Visit (HOSPITAL_COMMUNITY)
Admission: RE | Admit: 2013-05-23 | Discharge: 2013-05-23 | Disposition: A | Discharge status: Home / Self Care | Payer: Medicare Other | Source: Ambulatory Visit | Attending: Family Medicine | Admitting: Family Medicine

## 2013-05-23 DIAGNOSIS — C61 Malignant neoplasm of prostate: Secondary | ICD-10-CM

## 2013-05-23 DIAGNOSIS — K5730 Diverticulosis of large intestine without perforation or abscess without bleeding: Secondary | ICD-10-CM | POA: Insufficient documentation

## 2013-05-23 DIAGNOSIS — Z96649 Presence of unspecified artificial hip joint: Secondary | ICD-10-CM | POA: Insufficient documentation

## 2013-05-23 DIAGNOSIS — I2510 Atherosclerotic heart disease of native coronary artery without angina pectoris: Secondary | ICD-10-CM | POA: Insufficient documentation

## 2013-05-23 DIAGNOSIS — M51379 Other intervertebral disc degeneration, lumbosacral region without mention of lumbar back pain or lower extremity pain: Secondary | ICD-10-CM | POA: Insufficient documentation

## 2013-05-23 DIAGNOSIS — R5383 Other fatigue: Secondary | ICD-10-CM

## 2013-05-23 DIAGNOSIS — M5137 Other intervertebral disc degeneration, lumbosacral region: Secondary | ICD-10-CM | POA: Insufficient documentation

## 2013-05-23 DIAGNOSIS — R634 Abnormal weight loss: Secondary | ICD-10-CM

## 2013-05-23 DIAGNOSIS — I700 Atherosclerosis of aorta: Secondary | ICD-10-CM | POA: Insufficient documentation

## 2013-05-23 MED ORDER — DIPHENHYDRAMINE HCL 25 MG PO CAPS: 50.0000 mg | ORAL_CAPSULE | Freq: Once | ORAL | Status: DC

## 2013-05-23 MED ORDER — IOHEXOL 300 MG/ML  SOLN
100.0000 mL | Freq: Once | INTRAMUSCULAR | Status: AC | PRN
  Administered 2013-05-23: 100 mL via INTRAVENOUS

## 2013-06-14 ENCOUNTER — Encounter: Payer: Self-pay | Admitting: *Deleted

## 2013-06-15 ENCOUNTER — Encounter: Payer: Self-pay | Admitting: Internal Medicine

## 2013-06-15 ENCOUNTER — Ambulatory Visit (INDEPENDENT_AMBULATORY_CARE_PROVIDER_SITE_OTHER): Payer: Medicare Other | Admitting: Internal Medicine

## 2013-06-15 VITALS — BP 112/64 | HR 63 | Ht 70.0 in | Wt 164.0 lb

## 2013-06-15 DIAGNOSIS — R0602 Shortness of breath: Secondary | ICD-10-CM

## 2013-06-15 DIAGNOSIS — I471 Supraventricular tachycardia: Secondary | ICD-10-CM

## 2013-06-15 DIAGNOSIS — R5383 Other fatigue: Secondary | ICD-10-CM

## 2013-06-15 NOTE — Patient Instructions (Addendum)
Your physician wants you to follow-up in: 12 months with Dr Jacquiline Doe will receive a reminder letter in the mail two months in advance. If you don't receive a letter, please call our office to schedule the follow-up appointment.  Your physician has requested that you have an echocardiogram. Echocardiography is a painless test that uses sound waves to create images of your heart. It provides your doctor with information about the size and shape of your heart and how well your heart's chambers and valves are working. This procedure takes approximately one hour. There are no restrictions for this procedure.   Your physician has requested that you have a lexiscan myoview. For further information please visit [email or web address] Please follow instruction sheet, as given.

## 2013-06-19 DIAGNOSIS — R5383 Other fatigue: Secondary | ICD-10-CM | POA: Insufficient documentation

## 2013-06-19 DIAGNOSIS — R0602 Shortness of breath: Secondary | ICD-10-CM | POA: Insufficient documentation

## 2013-06-19 NOTE — Progress Notes (Signed)
PCP: Jonathan Gosselin, MD Primary Cardiologist: [name] is a 79 y.o. male who presents today for routine electrophysiology followup.  Since last being seen in our clinic, the patient reports doing well.  He was recently seen by Dr Clarene Duke and complained of fatigue.  He has also had significant weight loss.  The cause for these symptoms has not been determined and workup is ongoing.  He is referred to me today for consideration of cardiac causes for his fatigue.  He reports that he is tired during the day at at times his exercise tolerance is decreased.  He denies symptoms of arrhythmia.  He does have occasional SOB.  Today, he denies symptoms of palpitations, chest pain, lower extremity edema, dizziness, presyncope, or syncope.  The patient is otherwise without complaint today.   Past Medical History  Diagnosis Date  . Prostate cancer     s/p surgery with subsequent incontinence  . HTN (hypertension)   . Arthritis   . Gout   . Aortic insufficiency     mild  . Macular degeneration   . Glaucoma   . Hearing loss   . SVT (supraventricular tachycardia)    Past Surgical History  Procedure Laterality Date  . Bilateral total hip replacement    . Appendectomy    . Prostatectomy      with pelvic lymphadenectomy  . Inguinal hernia repair      Current Outpatient Prescriptions  Medication Sig Dispense Refill  . allopurinol (ZYLOPRIM) 100 MG tablet Take 100 mg by mouth every morning.       Marland Kitchen aspirin EC 81 MG tablet Take 81 mg by mouth every morning.      . brimonidine (ALPHAGAN) 0.15 % ophthalmic solution Place 1 drop into both eyes 2 (two) times daily.      Marland Kitchen diltiazem (TIAZAC) 240 MG 24 hr capsule Take 1 capsule (240 mg total) by mouth daily.  90 capsule  2  . Multiple Vitamins-Minerals (PRESERVISION AREDS PO) Take 1 capsule by mouth every morning.       No current facility-administered medications for this visit.    Physical Exam: Filed Vitals:   06/15/13 1434   BP: 112/64  Pulse: 63  Height: 5\' 10"  (1.778 m)  Weight: 164 lb (74.39 kg)    GEN- The patient is well appearing, alert and oriented x 3 today.   Head- normocephalic, atraumatic Eyes-  Sclera clear, conjunctiva pink Ears- hearing intact Oropharynx- clear Lungs- Clear to ausculation bilaterally, normal work of breathing Heart- Regular rate and rhythm, no murmurs, rubs or gallops, PMI not laterally displaced GI- soft, NT, ND, + BS Extremities- no clubbing, cyanosis, or edema  ekg today reveals sinus rhythm 63 bpm, PR 224 msec, LAHB, LVH  Assessment and Plan:  1. Fatigue/ decreased exercise tolerance Unclear etiology Will proceed with an echo to evaluate for structural heart changes as well as a lexiscan myoview to evaluate for ischemia.  He does not feel that he can walk on a treatmill due to orthopedic limitations.  2. SVT Well controlled  If above workup is negative then noncardiac causes for his symptoms will need to be considered. I will see again in 12 months

## 2013-06-20 ENCOUNTER — Ambulatory Visit (HOSPITAL_COMMUNITY): Payer: Medicare Other | Attending: Internal Medicine | Admitting: Radiology

## 2013-06-20 DIAGNOSIS — R0602 Shortness of breath: Secondary | ICD-10-CM | POA: Insufficient documentation

## 2013-06-20 DIAGNOSIS — I471 Supraventricular tachycardia, unspecified: Secondary | ICD-10-CM | POA: Insufficient documentation

## 2013-06-20 NOTE — Progress Notes (Signed)
Echocardiogram performed.  

## 2013-06-21 ENCOUNTER — Ambulatory Visit (HOSPITAL_COMMUNITY): Payer: Medicare Other | Attending: Cardiology | Admitting: Radiology

## 2013-06-21 VITALS — BP 138/56 | Ht 70.0 in | Wt 165.0 lb

## 2013-06-21 DIAGNOSIS — R0602 Shortness of breath: Secondary | ICD-10-CM

## 2013-06-21 DIAGNOSIS — I471 Supraventricular tachycardia, unspecified: Secondary | ICD-10-CM

## 2013-06-21 MED ORDER — TECHNETIUM TC 99M SESTAMIBI GENERIC - CARDIOLITE
33.0000 | Freq: Once | INTRAVENOUS | Status: AC | PRN
  Administered 2013-06-21: 33 via INTRAVENOUS

## 2013-06-21 MED ORDER — REGADENOSON 0.4 MG/5ML IV SOLN
0.4000 mg | Freq: Once | INTRAVENOUS | Status: AC
  Administered 2013-06-21: 0.4 mg via INTRAVENOUS

## 2013-06-21 MED ORDER — TECHNETIUM TC 99M SESTAMIBI GENERIC - CARDIOLITE
11.0000 | Freq: Once | INTRAVENOUS | Status: AC | PRN
  Administered 2013-06-21: 11 via INTRAVENOUS

## 2013-06-21 NOTE — Progress Notes (Signed)
[facility] SITE 3 NUCLEAR MED [address] [place], [place] [address] [phone]    Cardiology Nuclear Med Study  [name] is a 79 y.o. male     MRN : [id number]     DOB: May 11, 1932  Procedure Date: 06/21/2013  Nuclear Med Background Indication for Stress Test:  Evaluation for Ischemia History:  05/2009 AFIB, MPS: (-) ischemia [place], 06/20/13 ECHO: EF: 55-60% mild AR mild LVH  mild AI Cardiac Risk Factors: Hypertension  Symptoms:  DOE, Fatigue, Fatigue with Exertion and SOB   Nuclear Pre-Procedure Caffeine/Decaff Intake:  None > 12 hrs NPO After: 9:00pm   Lungs:  clear O2 Sat: 98% on room air. IV 0.9% NS with Angio Cath:  20g  IV Site: R Antecubital x 1, tolerated well IV Started by:  Irean Hong, RN  Chest Size (in):  43 Cup Size: n/a  Height: 5\' 10"  (1.778 m)  Weight:  165 lb (74.844 kg)  BMI:  Body mass index is 23.68 kg/(m^2). Tech Comments:  Took Tiazac this am    Nuclear Med Study 1 or 2 day study: 1 day  Stress Test Type:  Lexiscan  Reading MD: Cassell Clement, MD  Order Authorizing Provider:  Hillis Range, MD  Resting Radionuclide: Technetium 3m Sestamibi  Resting Radionuclide Dose: 11.0 mCi   Stress Radionuclide:  Technetium 65m Sestamibi  Stress Radionuclide Dose: 33.0 mCi           Stress Protocol Rest HR: 58 Stress HR: 83  Rest BP: 138/56 Stress BP: 136/62  Exercise Time (min): n/a METS: n/a   Predicted Max HR: 142 bpm % Max HR: 58.45 bpm Rate Pressure Product: 21308   Dose of Adenosine (mg):  n/a Dose of Lexiscan: 0.4 mg  Dose of Atropine (mg): n/a Dose of Dobutamine: n/a mcg/kg/min (at max HR)  Stress Test Technologist: Milana Na, EMT-P  Nuclear Technologist:  Domenic Polite, CNMT     Rest Procedure:  Myocardial perfusion imaging was performed at rest 45 minutes following the intravenous administration of Technetium 81m Sestamibi. Rest ECG: NSR - Normal EKG  Stress Procedure:  The patient received IV Lexiscan 0.4 mg  over 15-seconds.  Technetium 39m Sestamibi injected at 30-seconds. This patient was dizzy and lt. Headed with the Lexiscan injection. Quantitative spect images were obtained after a 45 minute delay. Stress ECG: There are scattered PACs.  QPS Raw Data Images:  Normal; no motion artifact; normal heart/lung ratio. Stress Images:  Normal homogeneous uptake in all areas of the myocardium. Rest Images:  Normal homogeneous uptake in all areas of the myocardium. Subtraction (SDS):  No evidence of ischemia. Transient Ischemic Dilatation (Normal <1.22):  n/a Lung/Heart Ratio (Normal <0.45):  0.28  Quantitative Gated Spect Images QGS EDV:  n/a QGS ESV:  n/a  Impression Exercise Capacity:  Lexiscan with no exercise. BP Response:  Normal blood pressure response. Clinical Symptoms:  No chest pain. ECG Impression:  No significant ST segment change suggestive of ischemia. Comparison with Prior Nuclear Study: No images to compare  Overall Impression:  Low risk stress nuclear study.  No evidence of ischemia or scar.  LV Ejection Fraction: Study not gated.  LV Wall Motion:  Study not gated. Consider 2D echo for evaluation of LV systolic function.   Cassell Clement

## 2013-06-27 ENCOUNTER — Telehealth: Payer: Self-pay | Admitting: Internal Medicine

## 2013-06-27 NOTE — Telephone Encounter (Signed)
New Problem  Pt wants the results of his previously taken test.

## 2013-06-27 NOTE — Telephone Encounter (Signed)
Echo/ stress test preliminary report reviewed, told him pt will receive report after Dr Johney Frame reviewed.

## 2013-07-07 ENCOUNTER — Telehealth: Payer: Self-pay | Admitting: Internal Medicine

## 2013-07-07 NOTE — Telephone Encounter (Signed)
New problem   The forms that Dr Johney Frame received from Dr Clarene Duke need to be filled out and sent to Dr Ave Filter at [facility] on [facility]. Pt is having ortho sx 07/12/13. Please call pt if any questions.

## 2013-07-07 NOTE — Telephone Encounter (Signed)
Cleared and faxed

## 2013-07-11 ENCOUNTER — Telehealth: Payer: Self-pay | Admitting: Internal Medicine

## 2013-07-11 NOTE — Telephone Encounter (Signed)
Documentation received from [facility] Proceed with surgery if medically necessary J. Allred

## 2013-07-14 ENCOUNTER — Ambulatory Visit: Payer: Medicare Other | Admitting: Internal Medicine

## 2013-07-15 ENCOUNTER — Telehealth: Payer: Self-pay | Admitting: Internal Medicine

## 2013-07-15 ENCOUNTER — Encounter: Payer: Self-pay | Admitting: *Deleted

## 2013-07-15 NOTE — Telephone Encounter (Signed)
New problem    Test result from stress test & echo.

## 2013-07-15 NOTE — Telephone Encounter (Signed)
Patient aware of test results.

## 2013-07-26 ENCOUNTER — Encounter: Payer: Self-pay | Admitting: *Deleted

## 2013-07-26 NOTE — Telephone Encounter (Signed)
This encounter was created in error - please disregard.

## 2013-09-15 ENCOUNTER — Other Ambulatory Visit: Payer: Self-pay

## 2013-11-02 ENCOUNTER — Other Ambulatory Visit: Payer: Self-pay

## 2013-11-02 MED ORDER — DILTIAZEM HCL ER BEADS 240 MG PO CP24: 240.0000 mg | ORAL_CAPSULE | Freq: Every day | ORAL | Status: DC

## 2014-08-15 ENCOUNTER — Other Ambulatory Visit: Payer: Self-pay | Admitting: *Deleted

## 2014-08-15 MED ORDER — DILTIAZEM HCL ER BEADS 240 MG PO CP24
240.0000 mg | ORAL_CAPSULE | Freq: Every day | ORAL | Status: DC
Start: 2014-08-15 — End: 2014-11-13

## 2014-10-19 ENCOUNTER — Other Ambulatory Visit: Payer: Self-pay | Admitting: Orthopedic Surgery

## 2014-10-19 ENCOUNTER — Other Ambulatory Visit: Payer: Self-pay

## 2014-10-19 ENCOUNTER — Encounter (HOSPITAL_BASED_OUTPATIENT_CLINIC_OR_DEPARTMENT_OTHER): Payer: Self-pay | Admitting: *Deleted

## 2014-10-19 ENCOUNTER — Encounter (HOSPITAL_BASED_OUTPATIENT_CLINIC_OR_DEPARTMENT_OTHER)
Admission: RE | Admit: 2014-10-19 | Discharge: 2014-10-19 | Disposition: A | Discharge status: Home / Self Care | Payer: Medicare Other | Source: Ambulatory Visit | Attending: Orthopedic Surgery | Admitting: Orthopedic Surgery

## 2014-10-19 DIAGNOSIS — Z01812 Encounter for preprocedural laboratory examination: Secondary | ICD-10-CM | POA: Diagnosis not present

## 2014-10-19 DIAGNOSIS — G5602 Carpal tunnel syndrome, left upper limb: Secondary | ICD-10-CM | POA: Diagnosis not present

## 2014-10-19 DIAGNOSIS — M109 Gout, unspecified: Secondary | ICD-10-CM | POA: Diagnosis not present

## 2014-10-19 DIAGNOSIS — H9190 Unspecified hearing loss, unspecified ear: Secondary | ICD-10-CM | POA: Diagnosis not present

## 2014-10-19 DIAGNOSIS — H409 Unspecified glaucoma: Secondary | ICD-10-CM | POA: Diagnosis not present

## 2014-10-19 DIAGNOSIS — Z809 Family history of malignant neoplasm, unspecified: Secondary | ICD-10-CM | POA: Diagnosis not present

## 2014-10-19 DIAGNOSIS — I10 Essential (primary) hypertension: Secondary | ICD-10-CM | POA: Diagnosis not present

## 2014-10-19 DIAGNOSIS — M1990 Unspecified osteoarthritis, unspecified site: Secondary | ICD-10-CM | POA: Diagnosis not present

## 2014-10-19 DIAGNOSIS — Z859 Personal history of malignant neoplasm, unspecified: Secondary | ICD-10-CM | POA: Diagnosis not present

## 2014-10-19 LAB — BASIC METABOLIC PANEL
ANION GAP: 11 (ref 5–15)
BUN: 24 mg/dL — ABNORMAL HIGH (ref 6–23)
CO2: 25 mEq/L (ref 19–32)
Calcium: 9.7 mg/dL (ref 8.4–10.5)
Chloride: 104 mEq/L (ref 96–112)
Creatinine, Ser: 1.09 mg/dL (ref 0.50–1.35)
GFR calc Af Amer: 72 mL/min — ABNORMAL LOW (ref >90)
GFR calc non Af Amer: 62 mL/min — ABNORMAL LOW (ref >90)
Glucose, Bld: 83 mg/dL (ref 70–99)
POTASSIUM: 4.6 meq/L (ref 3.7–5.3)
SODIUM: 140 meq/L (ref 137–147)

## 2014-10-19 NOTE — Progress Notes (Signed)
[id number]

## 2014-10-19 NOTE — H&P (Signed)
[name] is an 80 y.o. male.   CC / Reason for Visit: Bilateral hand problems HPI:  This patient returns for reevaluation area and he reports that he has not had any change in his left index finger trigger.  He further reports that he has had continuing numbness and tingling into his left thumb, index, long, and half of the ring finger.  He was somewhat confused on whether or not he had carpal tunnel syndrome on the left.  Presenting history follows: This patient is a 80 year old male who presents for evaluation of problems involving both hands.  Primary problem, for which he was referred, is numbness and tingling in the left hand that started greater than a year ago.  It is mostly nocturnal, but occasionally with some daytime symptoms.  He has worn a night splint which isn't helping anymore.  If he sleeps on his right side, he seems to have fewer symptoms than if sleeping on the left.  He had electrodiagnostic studies performed on 06-14-14 revealing severe left-sided carpal tunnel syndrome.  In addition, he complains of some catching that occurs in the left index finger, and the right small finger, as well as pain in the left thumb MP joint.  He has a history of gout and takes allopurinol.  Past Medical History  Diagnosis Date  . Prostate cancer     s/p surgery with subsequent incontinence  . Arthritis   . Gout   . Aortic insufficiency     mild  . Macular degeneration   . Glaucoma   . Hearing loss   . SVT (supraventricular tachycardia)   . HTN (hypertension)     no medication    Past Surgical History  Procedure Laterality Date  . Bilateral total hip replacement    . Appendectomy    . Prostatectomy      with pelvic lymphadenectomy  . Inguinal hernia repair      Family History  Problem Relation Age of Onset  . Cancer Mother   . Cancer Father    Social History:  reports that he has never smoked. He does not have any smokeless tobacco history on file. He reports that he drinks  alcohol. His drug history is not on file.  Allergies:  Allergies  Allergen Reactions  . Contrast Media [Iodinated Diagnostic Agents] Rash    Needs pre-medication for contrasted imaging studies    No prescriptions prior to admission    No results found for this or any previous visit (from the past 24 hour(s)). No results found.  Review of Systems  All other systems reviewed and are negative.   Height 5\' 10"  (1.778 m), weight 74.844 kg (165 lb). Physical Exam  Constitutional:  WD, WN, NAD HEENT:  NCAT, EOMI Neuro/Psych:  Alert & oriented to person, place, and time; appropriate mood & affect Lymphatic: No generalized UE edema or lymphadenopathy Extremities / MSK:  Both UE are normal with respect to appearance, ranges of motion, joint stability, muscle strength/tone, sensation, & perfusion except as otherwise noted:  Upon evaluation, no changes noted.  He still has some discomfort over the left index finger A1 pulley.  He continues to have muscle wasting in the thenar eminence.  Labs / X-rays:  No radiographic studies obtained today.  Assessment: 1.  Bilateral thumb TMC osteoarthritis--right side injected 08-17-14 2.  Left carpal tunnel syndrome--injection 07-20-14 3.  Trigger digit of the right small finger--injection 07-20-14 4.  Trigger digit of left index finger 5.  Trigger digit  of right thumb--injected 08-17-14  Plan:  The findings of his cumulative evaluations were explained to him again.  His reports from Dr. Mina Marble were reviewed which stated that he had left-sided severe carpal tunnel syndrome.  At this point, the patient reports he wants to move forward with left sided trigger finger release as well as ECTR L  The patient states that he would like to move forward with surgery. The intended site was confirmed fluoroscopically and marked with a pen. The details of the operative procedure were discussed with the patient.  Questions were invited and answered.  In addition to the  goal of the procedures, the risks of the procedure to include but not limited to bleeding; infection; damage to the nerves or blood vessels that could result in bleeding, numbness, weakness, chronic pain, and the need for additional procedures; stiffness; the need for revision surgery; and anesthetic risks were reviewed.  No specific outcome was guaranteed or implied.  Informed consent was obtained.   Isobel Eisenhuth A. 10/19/2014, 1:40 PM

## 2014-10-23 ENCOUNTER — Encounter (HOSPITAL_BASED_OUTPATIENT_CLINIC_OR_DEPARTMENT_OTHER)
Admission: RE | Disposition: A | Discharge status: Home / Self Care | Payer: Self-pay | Source: Ambulatory Visit | Attending: Orthopedic Surgery

## 2014-10-23 ENCOUNTER — Ambulatory Visit (HOSPITAL_BASED_OUTPATIENT_CLINIC_OR_DEPARTMENT_OTHER)
Admission: RE | Admit: 2014-10-23 | Discharge: 2014-10-23 | Disposition: A | Discharge status: Home / Self Care | Payer: Medicare Other | Source: Ambulatory Visit | Attending: Orthopedic Surgery | Admitting: Orthopedic Surgery

## 2014-10-23 ENCOUNTER — Ambulatory Visit (HOSPITAL_BASED_OUTPATIENT_CLINIC_OR_DEPARTMENT_OTHER): Payer: Medicare Other | Admitting: Certified Registered"

## 2014-10-23 ENCOUNTER — Encounter (HOSPITAL_BASED_OUTPATIENT_CLINIC_OR_DEPARTMENT_OTHER): Payer: Self-pay | Admitting: Certified Registered"

## 2014-10-23 DIAGNOSIS — Z809 Family history of malignant neoplasm, unspecified: Secondary | ICD-10-CM | POA: Insufficient documentation

## 2014-10-23 DIAGNOSIS — M109 Gout, unspecified: Secondary | ICD-10-CM | POA: Diagnosis not present

## 2014-10-23 DIAGNOSIS — Z859 Personal history of malignant neoplasm, unspecified: Secondary | ICD-10-CM | POA: Insufficient documentation

## 2014-10-23 DIAGNOSIS — H409 Unspecified glaucoma: Secondary | ICD-10-CM | POA: Insufficient documentation

## 2014-10-23 DIAGNOSIS — H9190 Unspecified hearing loss, unspecified ear: Secondary | ICD-10-CM | POA: Insufficient documentation

## 2014-10-23 DIAGNOSIS — M1990 Unspecified osteoarthritis, unspecified site: Secondary | ICD-10-CM | POA: Diagnosis not present

## 2014-10-23 DIAGNOSIS — I10 Essential (primary) hypertension: Secondary | ICD-10-CM | POA: Insufficient documentation

## 2014-10-23 DIAGNOSIS — G5602 Carpal tunnel syndrome, left upper limb: Secondary | ICD-10-CM | POA: Insufficient documentation

## 2014-10-23 HISTORY — PX: CARPAL TUNNEL RELEASE: SHX101

## 2014-10-23 HISTORY — PX: TRIGGER FINGER RELEASE: SHX641

## 2014-10-23 LAB — POCT HEMOGLOBIN-HEMACUE: Hemoglobin: 13.9 g/dL (ref 13.0–17.0)

## 2014-10-23 SURGERY — RELEASE, CARPAL TUNNEL, ENDOSCOPIC
Anesthesia: Monitor Anesthesia Care | Site: Wrist | Laterality: Left

## 2014-10-23 MED ORDER — BUPIVACAINE-EPINEPHRINE (PF) 0.5% -1:200000 IJ SOLN
INTRAMUSCULAR | Status: AC
  Filled 2014-10-23: qty 30

## 2014-10-23 MED ORDER — MIDAZOLAM HCL 2 MG/2ML IJ SOLN: 1.0000 mg | INTRAMUSCULAR | Status: DC | PRN

## 2014-10-23 MED ORDER — LIDOCAINE HCL (CARDIAC) 20 MG/ML IV SOLN
INTRAVENOUS | Status: DC | PRN
  Administered 2014-10-23: 30 mg via INTRAVENOUS

## 2014-10-23 MED ORDER — OXYCODONE HCL 5 MG/5ML PO SOLN: 5.0000 mg | Freq: Once | ORAL | Status: DC | PRN

## 2014-10-23 MED ORDER — FENTANYL CITRATE 0.05 MG/ML IJ SOLN
INTRAMUSCULAR | Status: AC
  Filled 2014-10-23: qty 2

## 2014-10-23 MED ORDER — LACTATED RINGERS IV SOLN: INTRAVENOUS | Status: DC

## 2014-10-23 MED ORDER — LIDOCAINE HCL 2 % IJ SOLN
INTRAMUSCULAR | Status: DC | PRN
  Administered 2014-10-23: 5 mL

## 2014-10-23 MED ORDER — FENTANYL CITRATE 0.05 MG/ML IJ SOLN
INTRAMUSCULAR | Status: DC | PRN
  Administered 2014-10-23: 50 ug via INTRAVENOUS

## 2014-10-23 MED ORDER — PROMETHAZINE HCL 25 MG/ML IJ SOLN: 6.2500 mg | INTRAMUSCULAR | Status: DC | PRN

## 2014-10-23 MED ORDER — OXYCODONE HCL 5 MG PO TABS: 5.0000 mg | ORAL_TABLET | Freq: Once | ORAL | Status: DC | PRN

## 2014-10-23 MED ORDER — OXYCODONE-ACETAMINOPHEN 5-325 MG PO TABS: 1.0000 | ORAL_TABLET | Freq: Four times a day (QID) | ORAL | Status: DC | PRN

## 2014-10-23 MED ORDER — PROPOFOL INFUSION 10 MG/ML OPTIME
INTRAVENOUS | Status: DC | PRN
  Administered 2014-10-23: 75 ug/kg/min via INTRAVENOUS

## 2014-10-23 MED ORDER — ONDANSETRON HCL 4 MG/2ML IJ SOLN
INTRAMUSCULAR | Status: DC | PRN
  Administered 2014-10-23: 4 mg via INTRAVENOUS

## 2014-10-23 MED ORDER — LIDOCAINE HCL 2 % IJ SOLN
INTRAMUSCULAR | Status: AC
  Filled 2014-10-23: qty 20

## 2014-10-23 MED ORDER — LACTATED RINGERS IV SOLN
INTRAVENOUS | Status: DC
  Administered 2014-10-23: 09:00:00 via INTRAVENOUS

## 2014-10-23 MED ORDER — BUPIVACAINE-EPINEPHRINE 0.5% -1:200000 IJ SOLN
INTRAMUSCULAR | Status: DC | PRN
  Administered 2014-10-23: 5 mL

## 2014-10-23 MED ORDER — CEFAZOLIN SODIUM-DEXTROSE 2-3 GM-% IV SOLR
2.0000 g | INTRAVENOUS | Status: AC
  Administered 2014-10-23: 2 g via INTRAVENOUS

## 2014-10-23 MED ORDER — HYDROMORPHONE HCL 1 MG/ML IJ SOLN: 0.2500 mg | INTRAMUSCULAR | Status: DC | PRN

## 2014-10-23 MED ORDER — CEFAZOLIN SODIUM-DEXTROSE 2-3 GM-% IV SOLR
INTRAVENOUS | Status: AC
  Filled 2014-10-23: qty 50

## 2014-10-23 MED ORDER — FENTANYL CITRATE 0.05 MG/ML IJ SOLN: 50.0000 ug | INTRAMUSCULAR | Status: DC | PRN

## 2014-10-23 MED ORDER — PROPOFOL 10 MG/ML IV EMUL
INTRAVENOUS | Status: AC
  Filled 2014-10-23: qty 50

## 2014-10-23 SURGICAL SUPPLY — 50 items
APPLICATOR COTTON TIP 6IN STRL (MISCELLANEOUS) ×4 IMPLANT
BLADE HOOK ENDO STRL (BLADE) ×4 IMPLANT
BLADE MINI RND TIP GREEN BEAV (BLADE) ×2 IMPLANT
BLADE SURG 15 STRL LF DISP TIS (BLADE) ×2 IMPLANT
BLADE SURG 15 STRL SS (BLADE) ×4
BLADE TRIANGLE EPF/EGR ENDO (BLADE) ×4 IMPLANT
BNDG CMPR 9X4 STRL LF SNTH (GAUZE/BANDAGES/DRESSINGS) ×2
BNDG COHESIVE 1X5 TAN STRL LF (GAUZE/BANDAGES/DRESSINGS) ×2 IMPLANT
BNDG COHESIVE 4X5 TAN STRL (GAUZE/BANDAGES/DRESSINGS) ×4 IMPLANT
BNDG CONFORM 2 STRL LF (GAUZE/BANDAGES/DRESSINGS) ×4 IMPLANT
BNDG ESMARK 4X9 LF (GAUZE/BANDAGES/DRESSINGS) ×4 IMPLANT
BNDG GAUZE ELAST 4 BULKY (GAUZE/BANDAGES/DRESSINGS) ×4 IMPLANT
CHLORAPREP W/TINT 26ML (MISCELLANEOUS) ×4 IMPLANT
CORDS BIPOLAR (ELECTRODE) IMPLANT
COVER BACK TABLE 60X90IN (DRAPES) ×4 IMPLANT
COVER MAYO STAND STRL (DRAPES) ×4 IMPLANT
CUFF TOURNIQUET SINGLE 18IN (TOURNIQUET CUFF) IMPLANT
DRAIN TLS ROUND 10FR (DRAIN) ×4 IMPLANT
DRAPE EXTREMITY T 121X128X90 (DRAPE) ×4 IMPLANT
DRAPE SURG 17X23 STRL (DRAPES) ×4 IMPLANT
DRSG ADAPTIC 3X8 NADH LF (GAUZE/BANDAGES/DRESSINGS) ×2 IMPLANT
DRSG EMULSION OIL 3X3 NADH (GAUZE/BANDAGES/DRESSINGS) ×4 IMPLANT
GLOVE BIO SURGEON STRL SZ7.5 (GLOVE) ×4 IMPLANT
GLOVE BIOGEL PI IND STRL 7.0 (GLOVE) ×2 IMPLANT
GLOVE BIOGEL PI IND STRL 8 (GLOVE) ×2 IMPLANT
GLOVE BIOGEL PI INDICATOR 7.0 (GLOVE) ×2
GLOVE BIOGEL PI INDICATOR 8 (GLOVE) ×2
GLOVE ECLIPSE 6.5 STRL STRAW (GLOVE) ×4 IMPLANT
GOWN STRL REUS W/ TWL LRG LVL3 (GOWN DISPOSABLE) ×4 IMPLANT
GOWN STRL REUS W/TWL LRG LVL3 (GOWN DISPOSABLE) ×8
GOWN STRL REUS W/TWL XL LVL3 (GOWN DISPOSABLE) ×4 IMPLANT
NDL HYPO 25X1 1.5 SAFETY (NEEDLE) IMPLANT
NDL SAFETY ECLIPSE 18X1.5 (NEEDLE) IMPLANT
NEEDLE HYPO 18GX1.5 SHARP (NEEDLE) ×4
NEEDLE HYPO 25X1 1.5 SAFETY (NEEDLE) ×4 IMPLANT
NS IRRIG 1000ML POUR BTL (IV SOLUTION) ×4 IMPLANT
PACK BASIN DAY SURGERY FS (CUSTOM PROCEDURE TRAY) ×4 IMPLANT
PADDING CAST ABS 4INX4YD NS (CAST SUPPLIES)
PADDING CAST ABS COTTON 4X4 ST (CAST SUPPLIES) IMPLANT
PADDING CAST COTTON 2X4 NS (CAST SUPPLIES) IMPLANT
SPONGE GAUZE 4X4 12PLY STER LF (GAUZE/BANDAGES/DRESSINGS) ×4 IMPLANT
STOCKINETTE 6  STRL (DRAPES) ×2
STOCKINETTE 6 STRL (DRAPES) ×2 IMPLANT
SUT VICRYL RAPIDE 4-0 (SUTURE) ×4 IMPLANT
SUT VICRYL RAPIDE 4/0 PS 2 (SUTURE) ×2 IMPLANT
SYR BULB 3OZ (MISCELLANEOUS) ×4 IMPLANT
SYRINGE 10CC LL (SYRINGE) IMPLANT
TOWEL OR 17X24 6PK STRL BLUE (TOWEL DISPOSABLE) ×8 IMPLANT
TOWEL OR NON WOVEN STRL DISP B (DISPOSABLE) ×4 IMPLANT
UNDERPAD 30X30 INCONTINENT (UNDERPADS AND DIAPERS) ×4 IMPLANT

## 2014-10-23 NOTE — Op Note (Signed)
10/23/2014  9:45 AM  PATIENT:  [name]  80 y.o. male  PRE-OPERATIVE DIAGNOSIS:  LEFT CARPAL TUNNEL SYNDROME/LEFT INDEX TRIGGER DIGIT  POST-OPERATIVE DIAGNOSIS:  Same  PROCEDURE:  Procedure(s): LEFT CARPAL TUNNEL RELEASE ENDOSCOPIC LEFT INDEX TRIGGER FINGER RELEASE  SURGEON:  Surgeon(s): Jolyn Nap, MD  PHYSICIAN ASSISTANT: Morley Kos, OPA-C  ANESTHESIA:  Local / MAC  SPECIMENS:  None  DRAINS:   None  EBL:  less than 50 mL  PREOPERATIVE INDICATIONS:  [name] is a  80 y.o. male with a diagnosis of LEFT CARPAL TUNNEL SYNDROME/LEFT INDEX TRIGGER DIGIT who failed conservative measures and elected for surgical management.    The risks benefits and alternatives were discussed with the patient preoperatively including but not limited to the risks of infection, bleeding, nerve injury, cardiopulmonary complications, the need for revision surgery, among others, and the patient verbalized understanding and consented to proceed.  OPERATIVE IMPLANTS: None  OPERATIVE FINDINGS: Tight carpal tunnel, acceptably decompressed following release; left index flexor tendons with fraying and tenosynovial thickening  OPERATIVE PROCEDURE:  After receiving prophylactic antibiotics, the patient was escorted to the operative theatre and placed in a supine position. He was sedated.  A surgical "time-out" was performed during which the planned procedure, proposed operative site, and the correct patient identity were compared to the operative consent and agreement confirmed by the circulating nurse according to current facility policy.  Following application of a tourniquet to the operative extremity, the planned incisions were marked and anesthetized with a mixture of lidocaine & bupivicaine containing epinephrine.  The exposed skin was prepped with Chloraprep and draped in the usual sterile fashion.  The limb was exsanguinated with an Esmarch bandage and the tourniquet inflated to  approximately 138mmHg higher than systolic BP.   The incisions were made sharply. Subcutaneous tissues were dissected with blunt and spreading dissection.  The trigger digit was then addressed first. The A1 pulley was visualized and split longitudinally down its midline with direct loupe assisted visualization. Attention was then directed proximal to this were some additional crossing bands were over the tendons and they were split as well. The tendons were pulled into view, found to be frayed in the region of interest as well as with tenosynovial thickening that was excisionally debrided.   At the proximal incision, the deep forearm fascia was split in line with the skin incision the distal edge grasped with a hemostat. At the mid palmar incision, the palmar fascia was split in line with the skin incision, revealing the underlying superficial palmar arch which was visualized with loupe assisted magnification. The synovial reflector was then introduced into the proximal incision and passed through the carpal canal, uses to reflect synovium from the deep surface of the transverse carpal ligament. It was removed and replaced with the slotted cannula and blunt obturator, passing from proximal to distal and exiting the distal wound superficial to the superficial palmar arch. The obturator was removed and the camera inserted. Visualization of the ligament was acceptable. The triangle shape blade was then inserted distally, advanced to the midportion of the ligament, and there used to create a perforation in the ligament. This instrument was removed and the hooked nstrument was inserted. It was placed into the perforation in the ligament and withdrawn distally, completing transection of the distal half of the ligament. The camera was then removed and placed into the distal end of the cannula. The hooked instrument was placed into the proximal end and advanced facility to be  placed into the apex of the V. which had  been formed to the distal hemi-transection of the ligament. It was withdrawn proximally, completing transection of the ligament. The adequacy of the release was judged with the scope and the instruments used as a probe. All of the endoscopic instruments are removed and the adequacy of release was again judged from the proximal incision perspective with direct loupe assisted visualization. In addition, the proximal forearm fascia was split for 2 inches proximal to the proximal incision under direct visualization using a sliding scissor technique. The tourniquet was released, the wound copiously irrigated. A TLS drain was placed to lie alongside the nerve exiting its own skin hole distally made with some sharp trocar. The skin was then closed with 4-0 Vicryl Rapide interrupted sutures. A light dressing was applied and the balance of 10 mL of the initial anesthetic mixture was placed down the drain, and the drain was clamped to be unclamped in the recovery room.  DISPOSITION:  The patient will be discharged home today, returning in 7-10 days for re-assessment.

## 2014-10-23 NOTE — Anesthesia Procedure Notes (Signed)
Procedure Name: MAC Date/Time: 10/23/2014 10:00 AM Performed by: Kali Deadwyler Pre-anesthesia Checklist: Patient identified, Emergency Drugs available, Suction available, Patient being monitored and Timeout performed Patient Re-evaluated:Patient Re-evaluated prior to inductionOxygen Delivery Method: Simple face mask

## 2014-10-23 NOTE — Interval H&P Note (Signed)
History and Physical Interval Note:  10/23/2014 9:45 AM  [name]  has presented today for surgery, with the diagnosis of LEFT CARPAL TUNNEL SYNDROME/LEFT INDEX TRIGGER DIGIT  The various methods of treatment have been discussed with the patient and family. After consideration of risks, benefits and other options for treatment, the patient has consented to  Procedure(s): LEFT CARPAL TUNNEL RELEASE ENDOSCOPIC (Left) LEFT INDEX TRIGGER FINGER RELEASE (Left) as a surgical intervention .  The patient's history has been reviewed, patient examined, no change in status, stable for surgery.  I have reviewed the patient's chart and labs.  Questions were answered to the patient's satisfaction.     Hera Celaya A.

## 2014-10-23 NOTE — Transfer of Care (Signed)
Immediate Anesthesia Transfer of Care Note  Patient: [name]  Procedure(s) Performed: Procedure(s): LEFT CARPAL TUNNEL RELEASE ENDOSCOPIC (Left) LEFT INDEX TRIGGER FINGER RELEASE (Left)  Patient Location: PACU  Anesthesia Type:MAC  Level of Consciousness: awake, alert , oriented and patient cooperative  Airway & Oxygen Therapy: Patient Spontanous Breathing and Patient connected to face mask oxygen  Post-op Assessment: Report given to PACU RN and Post -op Vital signs reviewed and stable  Post vital signs: Reviewed and stable  Complications: No apparent anesthesia complications

## 2014-10-23 NOTE — Discharge Instructions (Addendum)
Discharge Instructions ° ° °You have a light dressing on your hand.  °You may begin gentle motion of your fingers and hand immediately, but you should not do any heavy lifting or gripping.  Elevate your hand to reduce pain & swelling of the digits.  Ice over the operative site may be helpful to reduce pain & swelling.  DO NOT USE HEAT. °Pain medicine has been prescribed for you.  °Use your medicine as needed over the first 48 hours, and then you can begin to taper your use. You may use Tylenol in place of your prescribed pain medication, but not IN ADDITION to it. °Leave the dressing in place until the third day after your surgery and then remove it, leaving it open to air.  °After the bandage has been removed you may shower, but do not soak the incision.  °You may drive a car when you are off of prescription pain medications and can safely control your vehicle with both hands. °We will address whether therapy will be required or not when you return to the office. °You may have already made your follow-up appointment when we completed your preop visit.  If not, please call our office today or the next business day to make your return appointment for 10-15 days after surgery. ° ° °Please call [phone] during normal business hours or [phone] after hours for any problems. Including the following: ° °- excessive redness of the incisions °- drainage for more than 4 days °- fever of more than 101.5 F ° °*Please note that pain medications will not be refilled after hours or on weekends. ° ° °TLS Drain Instructions °You have a drain tube in place to help limit the amount of blood that collects under your skin in the early post-operative period.  The amount it drains will vary from person-to-person and is dependent upon many factors.  You will have 1-2 extra drain tubes sent with you from the facility.  You should change the tube according to these instructions below when the tube is about half full. ° °BE SURE TO  SLIDE THE CLAMP TO THE CLOSED POSITION BEFORE CHANGING THE TUBE.   ° °RE-OPEN THE CLAMP ONCE THE GLASS EVACUATION TUBE HAS BEEN CHANGED. ° °24 hours after surgery the amount of drainage will likely be negligible and it will be time to remove the tube and discard it.  Simply remove the tape that secures the flexible plastic drainage tube to the bandage and briskly pull the tube straight out.  You will likely feel a little discomfort during this process, but the tube should slide out as it is not sewn or otherwise secured directly to your body.  It is merely held in place by the bandage itself.  °                           ° °If you are not clear about when your first post-op appointment is scheduled, please call the office on the next business day to inquire about it.  Please also call the office if you have any other questions ([phone]).   ° °Post Anesthesia Home Care Instructions ° °Activity: °Get plenty of rest for the remainder of the day. A responsible adult should stay with you for 24 hours following the procedure.  °For the next 24 hours, DO NOT: °-Drive a car °-Operate machinery °-Drink alcoholic beverages °-Take any medication unless instructed by your physician °-Make any legal decisions or   or sign important papers.  Meals: Start with liquid foods such as gelatin or soup. Progress to regular foods as tolerated. Avoid greasy, spicy, heavy foods. If nausea and/or vomiting occur, drink only clear liquids until the nausea and/or vomiting subsides. Call your physician if vomiting continues.  Special Instructions/Symptoms: Your throat may feel dry or sore from the anesthesia or the breathing tube placed in your throat during surgery. If this causes discomfort, gargle with warm salt water. The discomfort should disappear within 24 hours.  Call your surgeon if you experience:   1.  Fever over 101.0. 2.  Inability to urinate. 3.  Nausea and/or vomiting. 4.  Extreme swelling or bruising at the  surgical site. 5.  Continued bleeding from the incision. 6.  Increased pain, redness or drainage from the incision. 7.  Problems related to your pain medication. 8. Any change in color, movement and/or sensation 9. Any problems and/or concerns

## 2014-10-23 NOTE — Anesthesia Preprocedure Evaluation (Addendum)
Anesthesia Evaluation  Patient identified by MRN, date of birth, ID band Patient awake    Reviewed: Allergy & Precautions, H&P , NPO status , Patient's Chart, lab work & pertinent test results  Airway Mallampati: II  TM Distance: >3 FB Neck ROM: Full    Dental  (+) Teeth Intact, Dental Advisory Given   Pulmonary neg pulmonary ROS,    Pulmonary exam normal       Cardiovascular hypertension, On Medications  Aortic insufficiency   Neuro/Psych negative neurological ROS  negative psych ROS   GI/Hepatic negative GI ROS, Neg liver ROS,   Endo/Other  negative endocrine ROS  Renal/GU negative Renal ROS     Musculoskeletal   Abdominal   Peds  Hematology   Anesthesia Other Findings   Reproductive/Obstetrics negative OB ROS                            Anesthesia Physical Anesthesia Plan  ASA: III  Anesthesia Plan: MAC   Post-op Pain Management:    Induction: Intravenous  Airway Management Planned: Simple Face Mask  Additional Equipment:   Intra-op Plan:   Post-operative Plan:   Informed Consent: I have reviewed the patients History and Physical, chart, labs and discussed the procedure including the risks, benefits and alternatives for the proposed anesthesia with the patient or authorized representative who has indicated his/her understanding and acceptance.   Dental advisory given  Plan Discussed with: Anesthesiologist, CRNA and Surgeon  Anesthesia Plan Comments:        Anesthesia Quick Evaluation

## 2014-10-23 NOTE — Anesthesia Postprocedure Evaluation (Signed)
Anesthesia Post Note  Patient: [name]  Procedure(s) Performed: Procedure(s) (LRB): LEFT CARPAL TUNNEL RELEASE ENDOSCOPIC (Left) LEFT INDEX TRIGGER FINGER RELEASE (Left)  Anesthesia type: MAC  Patient location: PACU  Post pain: Pain level controlled  Post assessment: Patient's Cardiovascular Status Stable  Last Vitals:  Filed Vitals:   10/23/14 1115  BP: 98/80  Pulse: 50  Temp:   Resp: 16    Post vital signs: Reviewed and stable  Level of consciousness: sedated  Complications: No apparent anesthesia complications

## 2014-10-24 ENCOUNTER — Encounter (HOSPITAL_BASED_OUTPATIENT_CLINIC_OR_DEPARTMENT_OTHER): Payer: Self-pay | Admitting: Orthopedic Surgery

## 2014-10-26 ENCOUNTER — Encounter (HOSPITAL_BASED_OUTPATIENT_CLINIC_OR_DEPARTMENT_OTHER): Payer: Self-pay | Admitting: Orthopedic Surgery

## 2014-11-13 ENCOUNTER — Other Ambulatory Visit: Payer: Self-pay | Admitting: Internal Medicine

## 2015-01-18 ENCOUNTER — Other Ambulatory Visit: Payer: Self-pay | Admitting: Internal Medicine

## 2015-04-02 ENCOUNTER — Ambulatory Visit: Payer: Self-pay | Admitting: Internal Medicine

## 2015-04-02 ENCOUNTER — Other Ambulatory Visit: Payer: Self-pay

## 2015-04-10 ENCOUNTER — Other Ambulatory Visit: Payer: Self-pay | Admitting: Internal Medicine

## 2015-04-13 IMAGING — CT CT ABD-PELV W/ CM
2 of 5 series · 15 of 46 positions shown, 17 images · IV contrast (omnipaque)
Comparison: 02/23/2013

CT CHEST

CLINICAL DATA: Prostate cancer

CT CHEST, ABDOMEN AND PELVIS WITH CONTRAST
TECHNIQUE: Multidetector CT imaging of the chest, abdomen and
pelvis was performed following the standard protocol during bolus
administration of intravenous contrast.
Contrast: 100mL OMNIPAQUE IOHEXOL 300 MG/ML  SOLN

[Series 2: cap with st · axial · 0.78mm/px · z∈[-561,+14]mm · 12 of 131 slices shown, 14 images]
[im 8/131  soft-tissue]
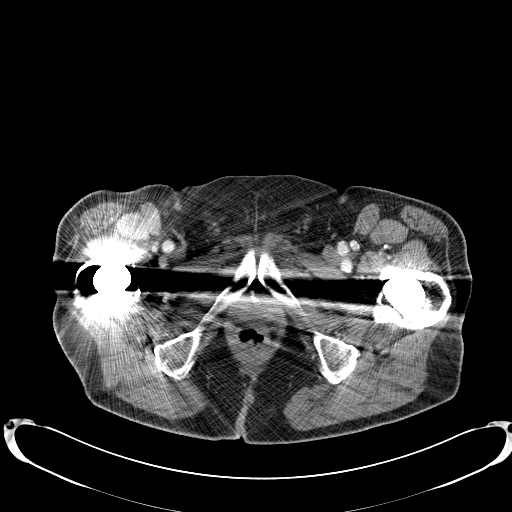
[im 8/131  bone]
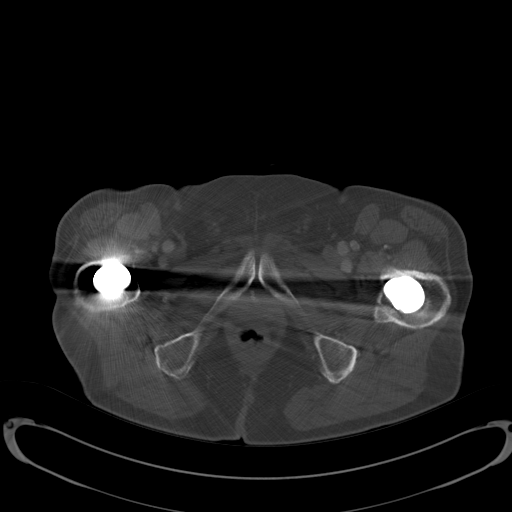
[im 23/131  soft-tissue]
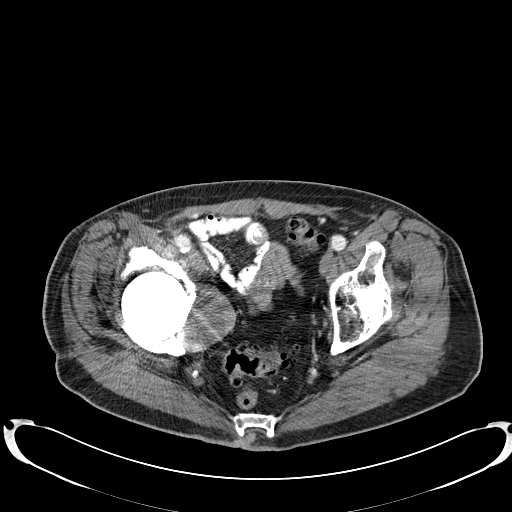
[im 31/131  soft-tissue]
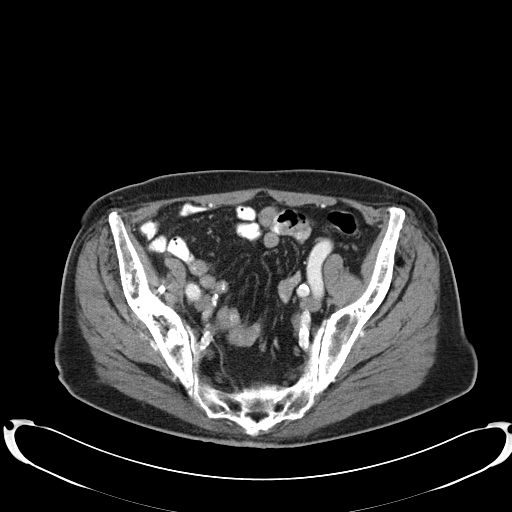
[im 39/131  soft-tissue]
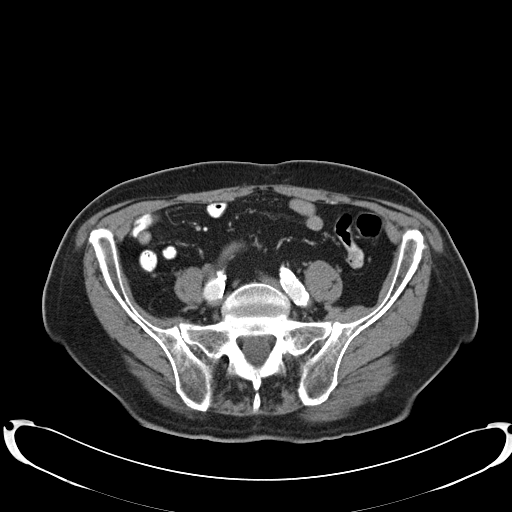
[im 54/131  soft-tissue]
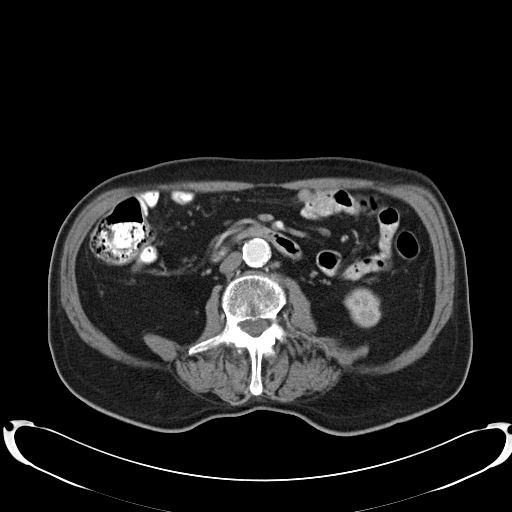
[im 62/131  soft-tissue]
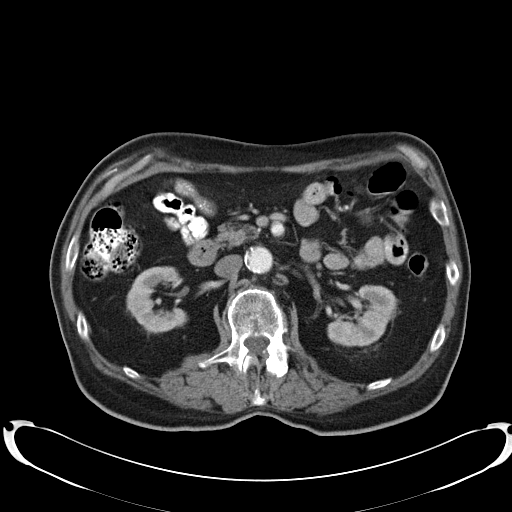
[im 69/131  soft-tissue]
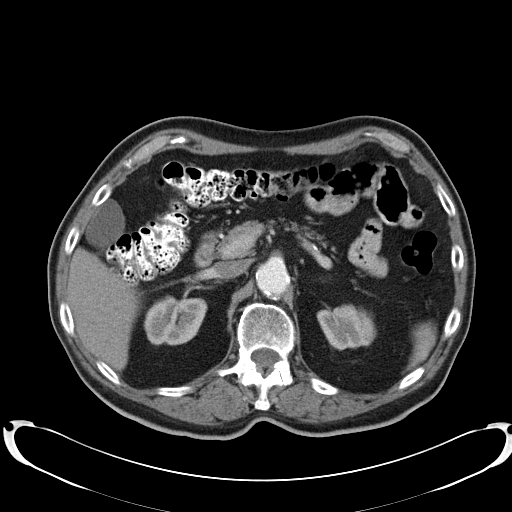
[im 85/131  soft-tissue]
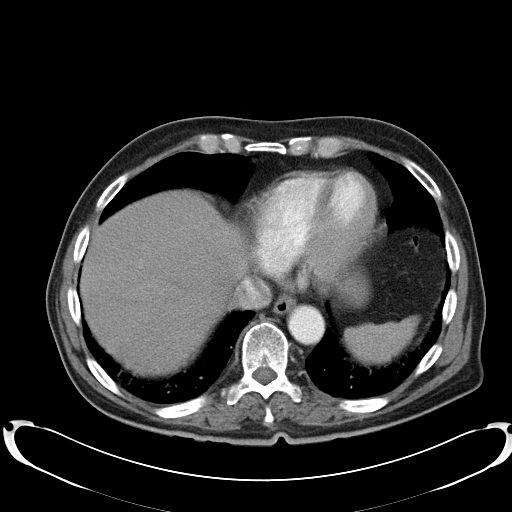
[im 92/131  soft-tissue]
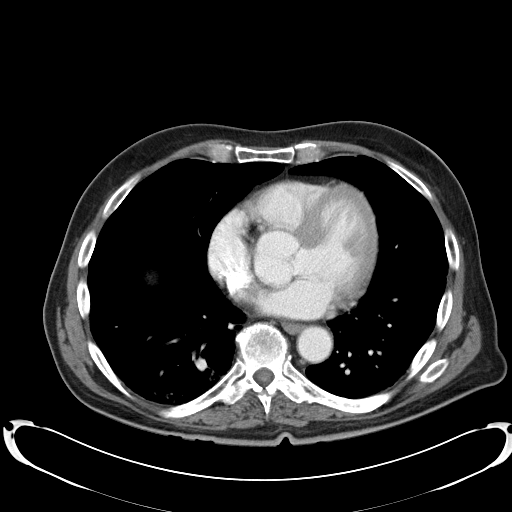
[im 92/131  bone]
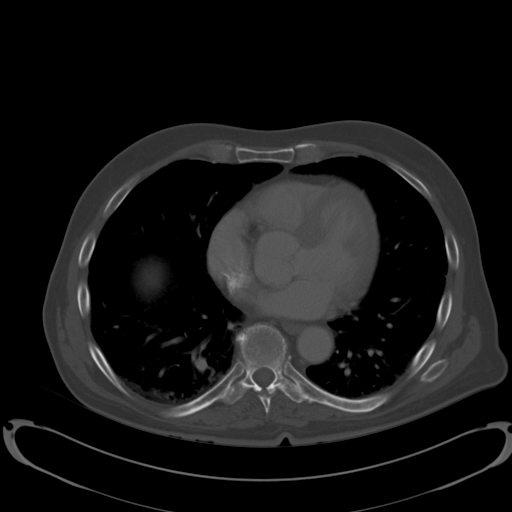
[im 100/131  soft-tissue]
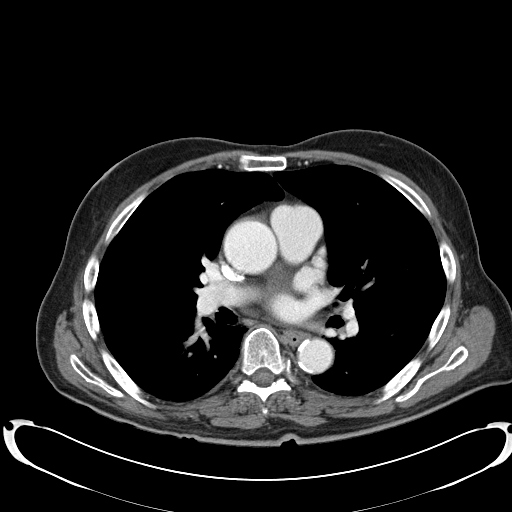
[im 115/131  soft-tissue]
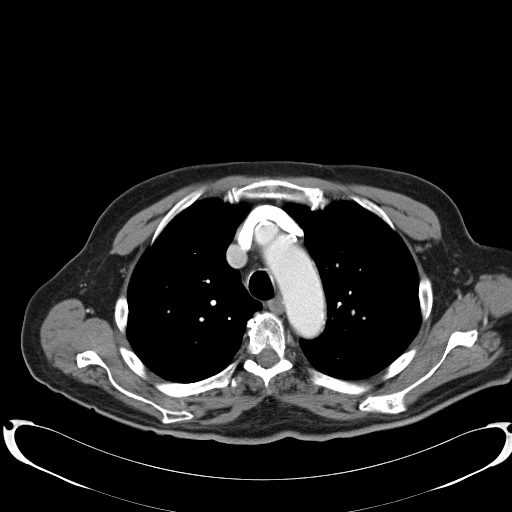
[im 123/131  soft-tissue]
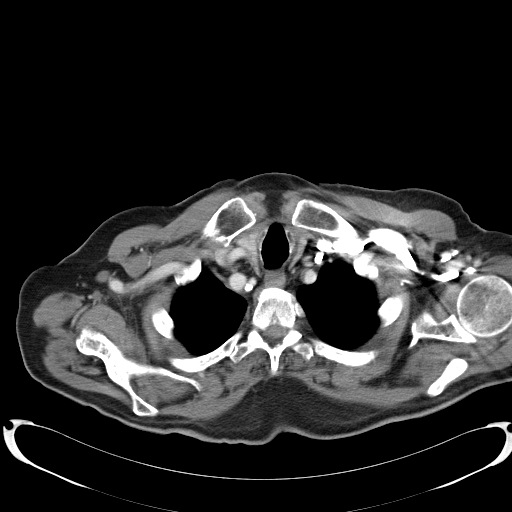

[Series 602: cor · coronal · 1.27mm/px · 3 of 73 slices shown]
[im 25/73  soft-tissue]
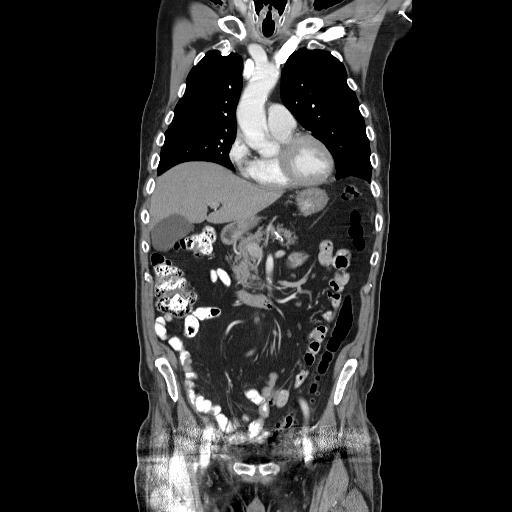
[im 33/73  soft-tissue]
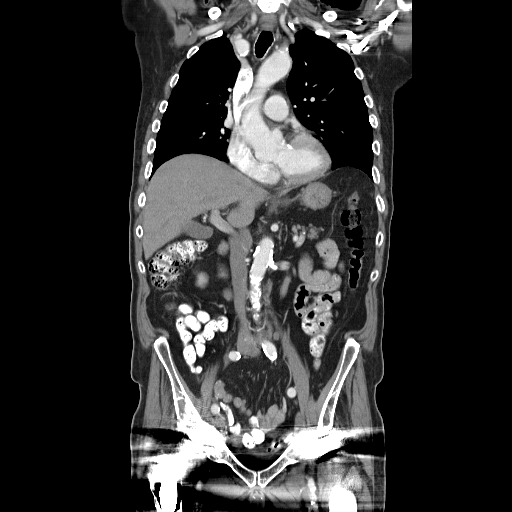
[im 41/73  soft-tissue]
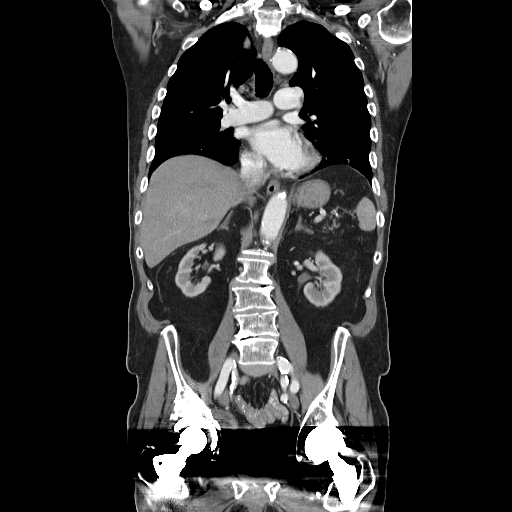

[15 of 46 positions shown; findings below may reference images not displayed]

FINDINGS: There is no pleural effusion.  No airspace consolidation
or atelectasis identified.  Dependent changes are identified in
both lung bases.

Possible 4 mm nodule is identified within the right lower lobe,
image 38/series 4. This is not seen on previous exam.  Remaining
portions of the lungs are clear.

The heart size appears normal.  No pericardial effusion.
Calcifications involving the thoracic aorta and LAD coronary artery
noted.  No mediastinal or hilar adenopathy.  There is no
supraclavicular or axillary adenopathy.

Review of the visualized osseous structures is negative for
aggressive lytic or sclerotic bone lesion.  No worrisome lytic or
sclerotic
IMPRESSION: 1.  No specific features identified to suggest thoracic metastasis.
2.  Possible 4 mm nodule in the right lower lobe.  Not seen on
prior exam.  Attention on follow-up imaging advised.

CT ABDOMEN AND PELVIS
FINDINGS: Stable 8 mm low attenuation structure along the dome of
the liver, image 48/series 2.  Within the right hepatic lobe there
is a 1.5 cm low attenuation structure, image 53/series 2.  Also
unchanged from previous exam.  The gallbladder is normal.  Normal
appearance of the common bile duct.  No biliary dilatation.
Pancreas is normal.  The spleen is unremarkable.  Normal appearance
of the adrenal glands.  The kidneys are unremarkable.  Beam
hardening artifact from the patient's hip arthroplasty devices
significantly diminishes exam detail within the pelvis.

There is calcified atherosclerotic disease affecting the abdominal
aorta.  No aneurysm identified.  Prominent retroperitoneal lymph
nodes are again noted.  Periaortic lymph node measures 9 mm, image
76/series 2.  Previously this measured 1.1 cm.  At the level of the
aortic bifurcation there is a 6 mm lymph node, image 86/series 2.
This is compared with 8 mm previously.  Large soft tissue view
attenuating structure within the right pelvic sidewall is
identified.  This may be associated with the right hip arthroplasty
device. This measures 3.9 x 5.6 cm, image 109/series 2.  Previously
this measured 3.3 x 5.1 cm.

There is normal appearance of the stomach.  The small bowel loops
are unremarkable. Multiple distal colonic diverticula identified.
No acute inflammation.

There is multilevel disc space narrowing and ventral endplate
spurring noted throughout the lumbar spine compatible with
degenerative disc disease.
IMPRESSION: 1.  Extensive beam hardening artifact from both hip arthroplasty
devices significantly diminishes exam detail within the pelvis.
2.  Prominent retroperitoneal lymph nodes within the upper abdomen
are slightly decreased in size from previous exam.
3.  Abnormal increased soft tissue along the right pelvic side wall
is slightly increased in size from previous exam.  This felt to  be
related to the patient's right hip arthroplasty device, perhaps
representing sequela of advanced particle wear osteolysis.

## 2015-05-07 ENCOUNTER — Other Ambulatory Visit: Payer: Self-pay

## 2015-05-07 ENCOUNTER — Ambulatory Visit (INDEPENDENT_AMBULATORY_CARE_PROVIDER_SITE_OTHER): Payer: Medicare Other | Admitting: Internal Medicine

## 2015-05-07 ENCOUNTER — Encounter: Payer: Self-pay | Admitting: Internal Medicine

## 2015-05-07 VITALS — BP 118/68 | HR 70 | Ht 71.0 in | Wt 163.6 lb

## 2015-05-07 DIAGNOSIS — I471 Supraventricular tachycardia: Secondary | ICD-10-CM

## 2015-05-07 MED ORDER — DILTIAZEM HCL ER BEADS 240 MG PO CP24: 240.0000 mg | ORAL_CAPSULE | Freq: Every day | ORAL | Status: DC

## 2015-05-07 NOTE — Progress Notes (Signed)
PCP: Jonathan Fess, MD Primary Cardiologist: Formerly Dr [name] is a 81 y.o. male who presents today for routine electrophysiology followup.  Since last being seen in our clinic, the patient reports doing well.  He has rare palpitations but feels that his SVT is quite well controlled.  He is active.  Today, he denies symptoms of chest pain, lower extremity edema, dizziness, presyncope, or syncope.  The patient is otherwise without complaint today.   Past Medical History  Diagnosis Date  . Prostate cancer     s/p surgery with subsequent incontinence  . Arthritis   . Gout   . Aortic insufficiency     mild  . Macular degeneration   . Glaucoma   . Hearing loss   . SVT (supraventricular tachycardia)   . HTN (hypertension)     no medication   Past Surgical History  Procedure Laterality Date  . Bilateral total hip replacement    . Appendectomy    . Prostatectomy      with pelvic lymphadenectomy  . Inguinal hernia repair    . Carpal tunnel release Left 10/23/2014    Procedure: LEFT CARPAL TUNNEL RELEASE ENDOSCOPIC;  Surgeon: Jolyn Nap, MD;  Location: [place];  Service: Orthopedics;  Laterality: Left;  . Trigger finger release Left 10/23/2014    Procedure: LEFT INDEX TRIGGER FINGER RELEASE;  Surgeon: Jolyn Nap, MD;  Location: [place];  Service: Orthopedics;  Laterality: Left;    Current Outpatient Prescriptions  Medication Sig Dispense Refill  . allopurinol (ZYLOPRIM) 100 MG tablet Take 100 mg by mouth every morning.     Marland Kitchen aspirin EC 81 MG tablet Take 81 mg by mouth at bedtime.     . brimonidine (ALPHAGAN) 0.15 % ophthalmic solution Place 1 drop into both eyes 2 (two) times daily.    Marland Kitchen diltiazem (TIAZAC) 240 MG 24 hr capsule Take 240 mg by mouth daily.    Marland Kitchen latanoprost (XALATAN) 0.005 % ophthalmic solution Place 1 drop into both eyes at bedtime.  6  . Multiple Vitamins-Minerals (PRESERVISION AREDS PO) Take 1 capsule by  mouth every morning.    Marland Kitchen oxyCODONE-acetaminophen (ROXICET) 5-325 MG per tablet Take 1-2 tablets by mouth every 6 (six) hours as needed for severe pain. 60 tablet 0   No current facility-administered medications for this visit.    Physical Exam: Filed Vitals:   05/07/15 1539  BP: 118/68  Pulse: 70  Height: 5\' 11"  (1.803 m)  Weight: 74.208 kg (163 lb 9.6 oz)    GEN- The patient is well appearing, alert and oriented x 3 today.   Head- normocephalic, atraumatic Eyes-  Sclera clear, conjunctiva pink Ears- hearing intact Oropharynx- clear Lungs- Clear to ausculation bilaterally, normal work of breathing Heart- Regular rate and rhythm, no murmurs, rubs or gallops, PMI not laterally displaced GI- soft, NT, ND, + BS Extremities- no clubbing, cyanosis, or edema  ekg today reveals sinus rhythm 70 bpm, PR 226 msec, LAHB, LVH  Assessment and Plan:  1.  SVT Well controlled  No further workup planned  I will see again in 12 months

## 2015-05-07 NOTE — Patient Instructions (Signed)

## 2015-05-07 NOTE — Addendum Note (Signed)
Addended by: Janan Halter F on: 05/07/2015 04:07 PM   Modules accepted: Orders

## 2015-05-22 ENCOUNTER — Other Ambulatory Visit: Payer: Self-pay | Admitting: *Deleted

## 2015-05-22 ENCOUNTER — Other Ambulatory Visit: Payer: Self-pay | Admitting: Orthopedic Surgery

## 2015-05-22 DIAGNOSIS — M79606 Pain in leg, unspecified: Secondary | ICD-10-CM

## 2015-05-22 DIAGNOSIS — I700 Atherosclerosis of aorta: Secondary | ICD-10-CM

## 2015-06-25 ENCOUNTER — Encounter: Payer: Self-pay | Admitting: Vascular Surgery

## 2015-06-26 ENCOUNTER — Ambulatory Visit (HOSPITAL_COMMUNITY)
Admission: RE | Admit: 2015-06-26 | Discharge: 2015-06-26 | Disposition: A | Discharge status: Home / Self Care | Payer: Medicare Other | Source: Ambulatory Visit | Attending: Vascular Surgery | Admitting: Vascular Surgery

## 2015-06-26 ENCOUNTER — Ambulatory Visit (INDEPENDENT_AMBULATORY_CARE_PROVIDER_SITE_OTHER): Payer: Medicare Other | Admitting: Vascular Surgery

## 2015-06-26 ENCOUNTER — Encounter: Payer: Self-pay | Admitting: Vascular Surgery

## 2015-06-26 VITALS — BP 129/57 | HR 56 | Temp 97.0°F | Resp 16 | Ht 71.0 in | Wt 167.0 lb

## 2015-06-26 DIAGNOSIS — M79604 Pain in right leg: Secondary | ICD-10-CM | POA: Diagnosis not present

## 2015-06-26 DIAGNOSIS — I700 Atherosclerosis of aorta: Secondary | ICD-10-CM

## 2015-06-26 NOTE — Progress Notes (Signed)
Referred by:  Maia Breslow, MD [place]  Reason for referral: aortic calcification  History of Present Illness  [name] is a 81 y.o. (09/09/1932) male who presents with chief complaint: right buttock, hip, thigh and knee pain for 6 weeks. He describes the pain as a "cramping" sensation that occurs both at rest and with exercise. He says this pain was relieved with steroid injection. His orthopedist, Dr. Eddie Dibbles observed progressive aortic calcifications on recent lumbar films and referred the patient here. He denies any history of intermittent claudication, rest pain or non-healing wounds. He is active and hikes regularly. He has been diagnosed previously with L5 radiculopathy.   His past medical history includes SVT, hypertension and gout. He takes a daily aspirin. He does not have hyperlipidemia or diabetes. He denies any prior cardiac or stroke history. He is not a smoker.   Past Medical History  Diagnosis Date  . Prostate cancer     s/p surgery with subsequent incontinence  . Arthritis   . Gout   . Aortic insufficiency     mild  . Macular degeneration   . Glaucoma   . Hearing loss   . SVT (supraventricular tachycardia)   . HTN (hypertension)     no medication    Past Surgical History  Procedure Laterality Date  . Bilateral total hip replacement    . Appendectomy    . Prostatectomy      with pelvic lymphadenectomy  . Inguinal hernia repair    . Carpal tunnel release Left 10/23/2014    Procedure: LEFT CARPAL TUNNEL RELEASE ENDOSCOPIC;  Surgeon: Jolyn Nap, MD;  Location: [place];  Service: Orthopedics;  Laterality: Left;  . Trigger finger release Left 10/23/2014    Procedure: LEFT INDEX TRIGGER FINGER RELEASE;  Surgeon: Jolyn Nap, MD;  Location: [place];  Service: Orthopedics;  Laterality: Left;    Social History   Social History  . Marital Status: Married    Spouse Name: N/A  . Number of Children:  N/A  . Years of Education: N/A   Occupational History  . Retired    Social History Main Topics  . Smoking status: Never Smoker   . Smokeless tobacco: Not on file  . Alcohol Use: Yes     Comment: 2 drinks per day  . Drug Use: Not on file  . Sexual Activity: Not on file   Other Topics Concern  . Not on file   Social History Narrative    Family History  Problem Relation Age of Onset  . Cancer Mother   . Cancer Father     Current Outpatient Prescriptions on File Prior to Visit  Medication Sig Dispense Refill  . allopurinol (ZYLOPRIM) 100 MG tablet Take 100 mg by mouth every morning.     Marland Kitchen aspirin EC 81 MG tablet Take 81 mg by mouth at bedtime.     . brimonidine (ALPHAGAN) 0.15 % ophthalmic solution Place 1 drop into both eyes 2 (two) times daily.    Marland Kitchen diltiazem (TIAZAC) 240 MG 24 hr capsule Take 1 capsule (240 mg total) by mouth daily. 90 capsule 3  . latanoprost (XALATAN) 0.005 % ophthalmic solution Place 1 drop into both eyes at bedtime.  6  . Multiple Vitamins-Minerals (PRESERVISION AREDS PO) Take 1 capsule by mouth every morning.    Marland Kitchen oxyCODONE-acetaminophen (ROXICET) 5-325 MG per tablet Take 1-2 tablets by mouth every 6 (six) hours as needed for severe pain. (Patient  not taking: Reported on 06/26/2015) 60 tablet 0   No current facility-administered medications on file prior to visit.    Allergies  Allergen Reactions  . Contrast Media [Iodinated Diagnostic Agents] Rash    Needs pre-medication for contrasted imaging studies    REVIEW OF SYSTEMS:  (Positives checked otherwise negative)  CARDIOVASCULAR:  [ ]  chest pain, [ ]  chest pressure, [x ] palpitations, [ ]  shortness of breath when laying flat, [ ]  shortness of breath with exertion,   [x ] pain in legs when walking, [ ]  pain in feet when laying flat, [ ]  history of blood clot in veins (DVT), [ ]  history of phlebitis, [ ]  swelling in legs, [ ]  varicose veins  PULMONARY:  [ ]  productive cough, [ ]  asthma, [ ]   wheezing  NEUROLOGIC:  [ ]  weakness in arms or legs, [ ]  numbness in arms or legs, [ ]  difficulty speaking or slurred speech, [ ]  temporary loss of vision in one eye, [ ]  dizziness  HEMATOLOGIC:  [ ]  bleeding problems, [ ]  problems with blood clotting too easily  MUSCULOSKEL:  [ ]  joint pain, [ ]  joint swelling  GASTROINTEST:  [ ]   Vomiting blood, [ ]   Blood in stool     GENITOURINARY:  [ ]   Burning with urination, [ ]   Blood in urine  PSYCHIATRIC:  [ ]  history of major depression  INTEGUMENTARY:  [ ]  rashes, [ ]  ulcers  CONSTITUTIONAL:  [ ]  fever, [ ]  chills  For VQI Use Only  PRE-ADM LIVING: Home  AMB STATUS: Ambulatory  CAD Sx: None  PRIOR CHF: None   Physical Examination Filed Vitals:   06/26/15 1330  BP: 129/57  Pulse: 56  Temp: 97 F (36.1 C)  Resp: 16  Height: 5\' 11"  (1.803 m)  Weight: 167 lb (75.751 kg)  SpO2: 98%   Body mass index is 23.3 kg/(m^2).  General: A&O x 3, WDWN male in NAD  Head: [place]/AT  Neck: Supple  Pulmonary: Sym exp, good air movt, CTAB, no rales, rhonchi, & wheezing  Cardiac: RRR, Nl S1, S2, no Murmurs, rubs or gallops  Vascular: 2+ radial, 2+ femoral, 2+ left popliteal and 2+ dorsalis pedis pulses bilaterally. Nonpalpable right popliteal and posterior tibial pulses.   Gastrointestinal: soft, NTND,  - masses  Musculoskeletal: M/S 5/5 throughou, Extremities without ischemic changes.  Neurologic: CN 2-12 intact grossly intact, Pain and light touch intact in extremities, Motor exam as listed above  Psychiatric: Judgment intact, Mood & affect appropriatefor pt's clinical situation  Dermatologic: See M/S exam for extremity exam, no rashes otherwise noted  Non-Invasive Vascular Imaging  ABI (Date: 06/26/2015)  R: 1.79, TBI: 0.62  L: 1.50, TBI: 0.60  Outside Studies/Documentation 4 pages of outside documents were reviewed including: medical records from Dr. Eddie Dibbles.  Medical Decision Making  [name] is a 81 y.o. male  who presents with intermittent right buttock, thigh and knee pain.    His pain is likely of neurologic etiology and not secondary to arterial insufficiency. His CT scan from 2014 revealed extensive calcification of his abdominal aorta. There does not appear to be any significant narrowing. His physical exam revealed normal arterial function with palpable femoral and pedal pulses.   He will follow up on an as needed basis.   Virgina Jock, PA-C [facility] Office: [phone] Pager: [phone]  06/26/2015, 1:59 PM   I have examined the patient, reviewed and agree with above. Completely normal peripheral  pulses. No evidence of arterial insufficiency causing his symptoms which appear to be related to degenerative disc disease. He does have a great deal of calcification seen on recent plain films and also on reviewing a CT scan of his abdomen from 2014. Explained this is upper limit any increased risk for peripheral vascular occlusive disease.  Curt Jews, MD 06/26/2015 2:23 PM

## 2016-01-10 ENCOUNTER — Telehealth: Payer: Self-pay | Admitting: *Deleted

## 2016-01-10 NOTE — Telephone Encounter (Signed)
Okay to take genric.  He will have them contact us to fill Cardizem CD

## 2016-01-10 NOTE — Telephone Encounter (Signed)
PLEASE CALL PT , HAS QUESTIONS REGARDING GENERIC DILTIZEM, THANKS

## 2016-01-18 ENCOUNTER — Other Ambulatory Visit: Payer: Self-pay | Admitting: *Deleted

## 2016-01-18 MED ORDER — DILTIAZEM HCL ER BEADS 240 MG PO CP24: 240.0000 mg | ORAL_CAPSULE | Freq: Every day | ORAL | Status: DC

## 2016-01-22 ENCOUNTER — Other Ambulatory Visit: Payer: Self-pay

## 2016-01-22 MED ORDER — DILTIAZEM HCL ER BEADS 240 MG PO CP24: 240.0000 mg | ORAL_CAPSULE | Freq: Every day | ORAL | Status: DC

## 2016-01-23 ENCOUNTER — Telehealth: Payer: Self-pay | Admitting: Internal Medicine

## 2016-01-23 MED ORDER — DILTIAZEM HCL ER COATED BEADS 240 MG PO CP24: 240.0000 mg | ORAL_CAPSULE | Freq: Every day | ORAL | Status: DC

## 2016-01-23 NOTE — Telephone Encounter (Signed)
Per patient [facility] says that the Cardizem CD is generic.  I have sent this in for him and asked he call me if problems.  It says generic when sending in.  The generic only cost 25 per month whereas the brand was over 100.  He appreciated my call and will let me know if any problems.

## 2016-01-23 NOTE — Telephone Encounter (Signed)
New Message     Pt want to apeak to rn about the wrong medication that was sent in

## 2016-08-25 ENCOUNTER — Ambulatory Visit (INDEPENDENT_AMBULATORY_CARE_PROVIDER_SITE_OTHER): Payer: Medicare Other | Admitting: Internal Medicine

## 2016-08-25 ENCOUNTER — Encounter: Payer: Self-pay | Admitting: Internal Medicine

## 2016-08-25 VITALS — BP 150/60 | HR 58 | Ht 71.0 in | Wt 164.0 lb

## 2016-08-25 DIAGNOSIS — I471 Supraventricular tachycardia: Secondary | ICD-10-CM

## 2016-08-25 NOTE — Progress Notes (Signed)
   PCP: Hulan Fess, MD Primary Cardiologist: Formerly Dr [name] is a 82 y.o. male who presents today for routine electrophysiology followup.  Since last being seen in our clinic, the patient reports doing well.   He is active.  No SVT in several years. Today, he denies symptoms of chest pain, lower extremity edema, dizziness, presyncope, or syncope.  The patient is otherwise without complaint today.   Past Medical History:  Diagnosis Date  . Aortic insufficiency    mild  . Arthritis   . Glaucoma   . Gout   . Hearing loss   . HTN (hypertension)    no medication  . Macular degeneration   . Prostate cancer [facility])    s/p surgery with subsequent incontinence  . SVT (supraventricular tachycardia) (HCC)    Past Surgical History:  Procedure Laterality Date  . APPENDECTOMY    . bilateral total hip replacement    . CARPAL TUNNEL RELEASE Left 10/23/2014   Procedure: LEFT CARPAL TUNNEL RELEASE ENDOSCOPIC;  Surgeon: Jolyn Nap, MD;  Location: [place];  Service: Orthopedics;  Laterality: Left;  . INGUINAL HERNIA REPAIR    . PROSTATECTOMY     with pelvic lymphadenectomy  . TRIGGER FINGER RELEASE Left 10/23/2014   Procedure: LEFT INDEX TRIGGER FINGER RELEASE;  Surgeon: Jolyn Nap, MD;  Location: [place];  Service: Orthopedics;  Laterality: Left;    Current Outpatient Prescriptions  Medication Sig Dispense Refill  . allopurinol (ZYLOPRIM) 100 MG tablet Take 100 mg by mouth every morning.     Marland Kitchen aspirin EC 81 MG tablet Take 81 mg by mouth at bedtime.     Marland Kitchen diltiazem (CARDIZEM CD) 240 MG 24 hr capsule Take 1 capsule (240 mg total) by mouth daily. 90 capsule 3  . latanoprost (XALATAN) 0.005 % ophthalmic solution Place 1 drop into both eyes at bedtime.  6  . Multiple Vitamins-Minerals (PRESERVISION AREDS PO) Take 1 capsule by mouth every morning.    . Tetrahydrozoline HCl (EYE DROPS OP) Apply to eye 2 (two) times daily. 1 drop each  eye     No current facility-administered medications for this visit.     Physical Exam: Vitals:   08/25/16 1503  BP: (!) 150/60  Pulse: (!) 58  Weight: 164 lb (74.4 kg)  Height: 5\' 11"  (1.803 m)    GEN- The patient is well appearing, alert and oriented x 3 today.   Head- normocephalic, atraumatic Eyes-  Sclera clear, conjunctiva pink Ears- hearing intact Oropharynx- clear Lungs- Clear to ausculation bilaterally, normal work of breathing Heart- Regular rate and rhythm, no murmurs, rubs or gallops, PMI not laterally displaced GI- soft, NT, ND, + BS Extremities- no clubbing, cyanosis, or edema  ekg today reveals sinus rhythm 58 bpm, PR 246 msec, LVH, repolarization abnormality  Assessment and Plan:  1.  SVT Well controlled  No further workup planned  I will see again in 12 months  Thompson Grayer MD, [facility] 08/25/2016 3:41 PM

## 2016-08-25 NOTE — Patient Instructions (Signed)

## 2016-11-19 ENCOUNTER — Ambulatory Visit (INDEPENDENT_AMBULATORY_CARE_PROVIDER_SITE_OTHER): Payer: Medicare Other

## 2016-11-19 ENCOUNTER — Encounter: Payer: Self-pay | Admitting: Internal Medicine

## 2016-11-19 ENCOUNTER — Ambulatory Visit (INDEPENDENT_AMBULATORY_CARE_PROVIDER_SITE_OTHER): Payer: Medicare Other | Admitting: Internal Medicine

## 2016-11-19 VITALS — BP 110/78 | HR 52 | Ht 71.0 in | Wt 165.2 lb

## 2016-11-19 DIAGNOSIS — I471 Supraventricular tachycardia: Secondary | ICD-10-CM | POA: Diagnosis not present

## 2016-11-19 DIAGNOSIS — R002 Palpitations: Secondary | ICD-10-CM

## 2016-11-19 DIAGNOSIS — R42 Dizziness and giddiness: Secondary | ICD-10-CM | POA: Diagnosis not present

## 2016-11-19 NOTE — Progress Notes (Signed)
PCP: Hulan Fess, MD Primary Cardiologist: Formerly Dr [name] is a 83 y.o. male who presents today for urgent electrophysiology followup.   He has become aware of elevations in heart rate.  These are different from prior SVT.  He describes gradual onset/ offset of tachypalpitations that typically accompany rising from a seated or lying position or with climbing stares.  These symptoms occur 6-7 times per day.  Heart rates are not as severely elevated as with prior SVT.  + dizziness with postural changes He denies recent illness.  Has not noticed bleeding.  No medicines changes. Today, he denies symptoms of chest pain, lower extremity edema, presyncope, or syncope.  The patient is otherwise without complaint today.   Past Medical History:  Diagnosis Date  . Aortic insufficiency    mild  . Arthritis   . Glaucoma   . Gout   . Hearing loss   . HTN (hypertension)    no medication  . Macular degeneration   . Prostate cancer [facility])    s/p surgery with subsequent incontinence  . SVT (supraventricular tachycardia) (HCC)    Past Surgical History:  Procedure Laterality Date  . APPENDECTOMY    . bilateral total hip replacement    . CARPAL TUNNEL RELEASE Left 10/23/2014   Procedure: LEFT CARPAL TUNNEL RELEASE ENDOSCOPIC;  Surgeon: Jolyn Nap, MD;  Location: [place];  Service: Orthopedics;  Laterality: Left;  . INGUINAL HERNIA REPAIR    . PROSTATECTOMY     with pelvic lymphadenectomy  . TRIGGER FINGER RELEASE Left 10/23/2014   Procedure: LEFT INDEX TRIGGER FINGER RELEASE;  Surgeon: Jolyn Nap, MD;  Location: [place];  Service: Orthopedics;  Laterality: Left;    Current Outpatient Prescriptions  Medication Sig Dispense Refill  . allopurinol (ZYLOPRIM) 100 MG tablet Take 100 mg by mouth every morning.     Marland Kitchen aspirin EC 81 MG tablet Take 81 mg by mouth at bedtime.     . Brinzolamide-Brimonidine (SIMBRINZA) 1-0.2 % SUSP Place 1  drop into both eyes 2 (two) times daily.    Marland Kitchen diltiazem (CARDIZEM CD) 240 MG 24 hr capsule Take 1 capsule (240 mg total) by mouth daily. 90 capsule 3  . glucosamine-chondroitin 500-400 MG tablet Take 1 tablet by mouth 2 (two) times daily.    Marland Kitchen latanoprost (XALATAN) 0.005 % ophthalmic solution Place 1 drop into both eyes at bedtime.  6  . Multiple Vitamins-Minerals (PRESERVISION AREDS PO) Take 1 capsule by mouth every morning.     No current facility-administered medications for this visit.     Physical Exam: Vitals:   11/19/16 1056  BP: 110/78  Pulse: (!) 52  Weight: 165 lb 3.2 oz (74.9 kg)  Height: 5\' 11"  (1.803 m)    GEN- The patient is well appearing, alert and oriented x 3 today.   Head- normocephalic, atraumatic Eyes-  Sclera clear, conjunctiva pink Ears- hearing intact Oropharynx- clear Lungs- Clear to ausculation bilaterally, normal work of breathing Heart- Regular rate and rhythm, no murmurs, rubs or gallops, PMI not laterally displaced GI- soft, NT, ND, + BS Extremities- no clubbing, cyanosis, or edema  ekg today reveals sinus bradycardia with PVCs  Assessment and Plan:  1.  Palpitations Does not sound like prior SVT.  More postural and may be suggestive of volume depletion. He is not orthostatic today   Check bmet, cbc and tsh 48 hour holter is ordered Echo to evaluate postural dizziness and palpitations  2. SVT He does not appear to have had issues with SVT since 2010.  I have reviewed notes going back that far.  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  Return in 2 weeks to follow-up with EP APP  Thompson Grayer MD, [facility] 11/19/2016 11:03 AM

## 2016-11-19 NOTE — Patient Instructions (Signed)
Medication Instructions:  Your physician recommends that you continue on your current medications as directed. Please refer to the Current Medication list given to you today.   Labwork: Your physician recommends that you return for lab work today: TSH/CBC/BMP   Testing/Procedures: Your physician has requested that you have an echocardiogram. Echocardiography is a painless test that uses sound waves to create images of your heart. It provides your doctor with information about the size and shape of your heart and how well your heart's chambers and valves are working. This procedure takes approximately one hour. There are no restrictions for this procedure.  Your physician has recommended that you wear a holter monitor. Holter monitors are medical devices that record the heart's electrical activity. Doctors most often use these monitors to diagnose arrhythmias. Arrhythmias are problems with the speed or rhythm of the heartbeat. The monitor is a small, portable device. You can wear one while you do your normal daily activities. This is usually used to diagnose what is causing palpitations/syncope (passing out).--48 hour    Follow-Up: Your physician recommends that you schedule a follow-up appointment in: 2 weeks with EP APP   Any Other Special Instructions Will Be Listed Below (If Applicable).     If you need a refill on your cardiac medications before your next appointment, please call your pharmacy.

## 2016-11-21 DIAGNOSIS — R42 Dizziness and giddiness: Secondary | ICD-10-CM

## 2016-11-21 DIAGNOSIS — R002 Palpitations: Secondary | ICD-10-CM

## 2016-11-24 ENCOUNTER — Other Ambulatory Visit: Payer: Self-pay

## 2016-11-24 ENCOUNTER — Ambulatory Visit (HOSPITAL_COMMUNITY): Payer: Medicare Other | Attending: Cardiovascular Disease

## 2016-11-24 DIAGNOSIS — R002 Palpitations: Secondary | ICD-10-CM

## 2016-11-24 DIAGNOSIS — R42 Dizziness and giddiness: Secondary | ICD-10-CM

## 2016-11-24 DIAGNOSIS — I501 Left ventricular failure: Secondary | ICD-10-CM | POA: Diagnosis not present

## 2016-11-24 DIAGNOSIS — I351 Nonrheumatic aortic (valve) insufficiency: Secondary | ICD-10-CM | POA: Insufficient documentation

## 2016-11-24 DIAGNOSIS — I348 Other nonrheumatic mitral valve disorders: Secondary | ICD-10-CM | POA: Diagnosis not present

## 2016-12-08 ENCOUNTER — Ambulatory Visit: Payer: Medicare Other | Admitting: Physician Assistant

## 2016-12-08 NOTE — Progress Notes (Signed)
Cardiology Office Note Date:  12/09/2016  Patient ID:  [name], DOB 05-26-1932, MRN [id number] PCP:  Gennette Pac, MD  Cardiologist:  Remotely Dr. Radford Pax Electrophysiologist: Dr. Rayann Heman   Chief Complaint: planned f/u labs/echo, holter  History of Present Illness: [name] is a 83 y.o. 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 Rayann Heman.   follow up on testing results.  He was last seen by Dr. Rayann Heman 11/19/16 as an add-on/urgent visit with c/o palpitations.  They were described as: "different from prior SVT.  He describes gradual onset/ offset of tachypalpitations that typically accompany rising from a seated or lying position or with climbing stares.  These symptoms occur 6-7 times per day.  Heart rates are not as severely elevated as with prior SVT.  + dizziness with postural changes".  Dr. Rayann Heman suspected this to be volume depletion given postural nature of his symptoms.  He comes today observing in the last 2 weeks his palpitations are much less often and shorter duration.  He denies any kind of CP or SOB, with his longer episodes of the palpitations felt a little lightheaded, no near syncope or syncope.  He states the days with so much snow he was out shoveling and working physically hard without difficulty or symptoms.  Reports over the years about once a year having some palpitations, but this time seemed to be more persistent.  Past Medical History:  Diagnosis Date  . Aortic insufficiency    mild  . Arthritis   . Glaucoma   . Gout   . Hearing loss   . HTN (hypertension)    no medication  . Macular degeneration   . Prostate cancer [facility])    s/p surgery with subsequent incontinence  . SVT (supraventricular tachycardia) (HCC)     Past Surgical History:  Procedure Laterality Date  . APPENDECTOMY    . bilateral total hip replacement    . CARPAL TUNNEL  RELEASE Left 10/23/2014   Procedure: LEFT CARPAL TUNNEL RELEASE ENDOSCOPIC;  Surgeon: Jolyn Nap, MD;  Location: [place];  Service: Orthopedics;  Laterality: Left;  . INGUINAL HERNIA REPAIR    . PROSTATECTOMY     with pelvic lymphadenectomy  . TRIGGER FINGER RELEASE Left 10/23/2014   Procedure: LEFT INDEX TRIGGER FINGER RELEASE;  Surgeon: Jolyn Nap, MD;  Location: [place];  Service: Orthopedics;  Laterality: Left;    Current Outpatient Prescriptions  Medication Sig Dispense Refill  . allopurinol (ZYLOPRIM) 100 MG tablet Take 100 mg by mouth every morning.     Marland Kitchen aspirin EC 81 MG tablet Take 81 mg by mouth at bedtime.     . Brinzolamide-Brimonidine (SIMBRINZA) 1-0.2 % SUSP Place 1 drop into both eyes 2 (two) times daily. TAKING SIMBRINZA BRAND NAME    . diltiazem (CARDIZEM CD) 240 MG 24 hr capsule Take 1 capsule (240 mg total) by mouth daily. 90 capsule 3  . glucosamine-chondroitin 500-400 MG tablet Take 1 tablet by mouth 2 (two) times daily.    Marland Kitchen latanoprost (XALATAN) 0.005 % ophthalmic solution Place 1 drop into both eyes at bedtime.  6  . Multiple Vitamins-Minerals (PRESERVISION AREDS PO) Take 1 capsule by mouth every morning.     No current facility-administered medications for this visit.     Allergies:   Contrast media [iodinated diagnostic agents]   Social  History:  The patient  reports that he has never smoked. He does not have any smokeless tobacco history on file. He reports that he drinks alcohol.   Family History:  The patient's family history includes Cancer in his father and mother.  ROS:  Please see the history of present illness.    All other systems are reviewed and otherwise negative.   PHYSICAL EXAM:  VS:  BP 130/60   Pulse 63   Ht 5\' 11"  (1.803 m)   Wt 166 lb (75.3 kg)   BMI 23.15 kg/m  BMI: Body mass index is 23.15 kg/m. Well nourished, well developed, in no acute distress  HEENT: normocephalic, atraumatic    Neck: no JVD, carotid bruits or masses Cardiac:  RRR; no significant murmurs, no rubs, or gallops Lungs:  CTA b/l, no wheezing, rhonchi or rales  Abd: soft, nontender MS: no deformity or atrophy Ext: no edema  Skin: warm and dry, no rash Neuro:  No gross deficits appreciated Psych: euthymic mood, full affect   EKG:  Done 11/19/16 showed  SB, 52bpm with PVCs 11/19/16: 48 hour holter (unread officially) Avg HR 67 Min HR 41 (@ 0445) Max HR 172 (@1355 ) Tracings are reviewed with Dr. 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 Wall   thickness was increased in a pattern of mild LVH. There was mild   focal basal hypertrophy of the septum. Systolic function was   normal. The estimated ejection fraction was in the range of 55%   to 65%. Wall motion was normal; there were no regional wall   motion abnormalities. Doppler parameters are consistent with both   elevated ventricular end-diastolic filling pressure and elevated   left atrial filling pressure. - Aortic valve: There was mild regurgitation. - Mitral valve: Calcified annulus. - Atrial septum: No defect or patent foramen ovale was identified  06/21/13: Lexiscan stress test Overall Impression:  Low risk stress nuclear study.  No evidence of ischemia or scar. LV Ejection Fraction: Study not gated.  LV Wall Motion:  Study not gated. Consider 2D echo for evaluation of LV systolic function.   Recent Labs: No results found for requested labs within last 8760 hours.  No results found for requested labs within last 8760 hours.   CrCl cannot be calculated (Patient's most recent lab result is older than the maximum 21 days allowed.).   Wt Readings from Last 3 Encounters:  12/09/16 166 lb (75.3 kg)  11/19/16 165 lb 3.2 oz (74.9 kg)  08/25/16 164 lb (74.4 kg)     Other  studies reviewed: Additional studies/records reviewed today include: summarized above  ASSESSMENT AND PLAN:   1. Palpitations 2. SVT  Recommend revisiting with Dr. Rayann Heman to discuss ablation.  The patient is very hesitant, states back when this first started, the cardiologist at the time suggested a procedure but another said not to.  He reports feeling very well and says his palpitations are settling down and would prefer not to pursue procedures.  His baseline HR appears to be 50's - 60's, will hold off up-titration of his diltiazem, discussed using short acting dose for PRN use, but he states his palpitations are much less and will hold off.   Should he have recurrent excalating palpitations or more symptomatic with them will revisit/reconsider ablation.  He is encouraged to keep well hydrated, avoid any  potential triggers, stimulants.   Disposition: F/u with Dr. Rayann Heman in 3 months, sooner if needed.  If no recurrent palpitations and feeling well can move out further.  The patient was allowed to leave without getting labs done, he will be called and asked to come in at his convenience to check BMET, CBC, TSH.  Current medicines are reviewed at length with the patient today.  The patient did not have any concerns regarding medicines.  Haywood Lasso, PA-C 12/09/2016 1:18 PM     [address] [place] [address] [phone] (office)  [phone] (fax)

## 2016-12-09 ENCOUNTER — Ambulatory Visit (INDEPENDENT_AMBULATORY_CARE_PROVIDER_SITE_OTHER): Payer: Medicare Other | Admitting: Physician Assistant

## 2016-12-09 VITALS — BP 130/60 | HR 63 | Ht 71.0 in | Wt 166.0 lb

## 2016-12-09 DIAGNOSIS — I471 Supraventricular tachycardia: Secondary | ICD-10-CM

## 2016-12-09 DIAGNOSIS — R002 Palpitations: Secondary | ICD-10-CM

## 2016-12-09 NOTE — Patient Instructions (Addendum)
Medication Instructions:   Your physician recommends that you continue on your current medications as directed. Please refer to the Current Medication list given to you today.   If you need a refill on your cardiac medications before your next appointment, please call your pharmacy.  Labwork:  NONE ORDERED  TODAY    Testing/Procedures: NONE ORDERED  TODAY    Follow-Up:  IN 3 MONTHS WITH ALLRED    Any Other Special Instructions Will Be Listed Below (If Applicable).

## 2016-12-11 ENCOUNTER — Telehealth: Payer: Self-pay | Admitting: *Deleted

## 2016-12-11 NOTE — Telephone Encounter (Signed)
SPOKE TO PT AND WILL CALL BACK  IN THE MORNING TO LET ME KNOW WHEN HE CAN COME IN FOR LABS.

## 2016-12-11 NOTE — Addendum Note (Signed)
Addended by: Claude Manges on: 12/11/2016 04:44 PM   Modules accepted: Orders

## 2016-12-12 ENCOUNTER — Other Ambulatory Visit: Payer: Medicare Other | Admitting: *Deleted

## 2016-12-12 DIAGNOSIS — R002 Palpitations: Secondary | ICD-10-CM

## 2016-12-12 DIAGNOSIS — I471 Supraventricular tachycardia: Secondary | ICD-10-CM

## 2016-12-12 LAB — BASIC METABOLIC PANEL
BUN / CREAT RATIO: 24 (ref 10–24)
BUN: 27 mg/dL (ref 8–27)
CHLORIDE: 103 mmol/L (ref 96–106)
CO2: 21 mmol/L (ref 18–29)
Calcium: 9.6 mg/dL (ref 8.6–10.2)
Creatinine, Ser: 1.13 mg/dL (ref 0.76–1.27)
GFR calc non Af Amer: 60 mL/min/{1.73_m2} (ref >59)
GFR, EST AFRICAN AMERICAN: 70 mL/min/{1.73_m2} (ref >59)
Glucose: 87 mg/dL (ref 65–99)
Potassium: 4.4 mmol/L (ref 3.5–5.2)
Sodium: 141 mmol/L (ref 134–144)

## 2016-12-12 LAB — CBC
Hematocrit: 41.2 % (ref 37.5–51.0)
Hemoglobin: 13.7 g/dL (ref 13.0–17.7)
MCH: 31.3 pg (ref 26.6–33.0)
MCHC: 33.3 g/dL (ref 31.5–35.7)
MCV: 94 fL (ref 79–97)
PLATELETS: 258 10*3/uL (ref 150–379)
RBC: 4.38 x10E6/uL (ref 4.14–5.80)
RDW: 14.1 % (ref 12.3–15.4)
WBC: 8.5 10*3/uL (ref 3.4–10.8)

## 2016-12-12 LAB — TSH: TSH: 2.2 u[IU]/mL (ref 0.450–4.500)

## 2016-12-16 ENCOUNTER — Telehealth: Payer: Self-pay | Admitting: *Deleted

## 2016-12-16 NOTE — Telephone Encounter (Signed)
-----   Message from [facility], [place] sent at 12/15/2016  2:47 PM EST ----- Please let the patient know his labs look good.  Thanks [employer]

## 2016-12-16 NOTE — Telephone Encounter (Signed)
SPOKE TO PT ABOUT RESULTS 

## 2017-01-02 ENCOUNTER — Other Ambulatory Visit: Payer: Self-pay | Admitting: Internal Medicine

## 2017-01-02 MED ORDER — DILTIAZEM HCL ER COATED BEADS 240 MG PO CP24: 240.0000 mg | ORAL_CAPSULE | Freq: Every day | ORAL | 3 refills | Status: DC

## 2017-03-04 ENCOUNTER — Ambulatory Visit: Payer: Medicare Other | Admitting: Internal Medicine

## 2017-03-09 ENCOUNTER — Encounter: Payer: Self-pay | Admitting: Internal Medicine

## 2017-03-09 ENCOUNTER — Ambulatory Visit (INDEPENDENT_AMBULATORY_CARE_PROVIDER_SITE_OTHER): Payer: Medicare Other | Admitting: Internal Medicine

## 2017-03-09 VITALS — BP 112/60 | HR 66 | Ht 71.0 in | Wt 161.0 lb

## 2017-03-09 DIAGNOSIS — R55 Syncope and collapse: Secondary | ICD-10-CM

## 2017-03-09 DIAGNOSIS — I471 Supraventricular tachycardia: Secondary | ICD-10-CM

## 2017-03-09 NOTE — Progress Notes (Signed)
PCP: Hulan Fess, MD Primary Cardiologist: Formerly Dr [name] is a 83 y.o. male who presents today for urgent electrophysiology followup.   Currently doing well, without any further symptoms of SVT.  He has declined ablation.  He did have nocturnal syncope several weeks ago.  He got up during the night and urinated.  He then developed nause and had collapsed.  After several seconds he woke and felt "normal".  He did not seek medical attention and has done well since that time.  Today, he denies symptoms of chest pain, SOB lower extremity edema..  The patient is otherwise without complaint today.   Past Medical History:  Diagnosis Date  . Aortic insufficiency    mild  . Arthritis   . Glaucoma   . Gout   . Hearing loss   . HTN (hypertension)    no medication  . Macular degeneration   . Prostate cancer [facility])    s/p surgery with subsequent incontinence  . SVT (supraventricular tachycardia) (HCC)    Past Surgical History:  Procedure Laterality Date  . APPENDECTOMY    . bilateral total hip replacement    . CARPAL TUNNEL RELEASE Left 10/23/2014   Procedure: LEFT CARPAL TUNNEL RELEASE ENDOSCOPIC;  Surgeon: Jolyn Nap, MD;  Location: [place];  Service: Orthopedics;  Laterality: Left;  . INGUINAL HERNIA REPAIR    . PROSTATECTOMY     with pelvic lymphadenectomy  . TRIGGER FINGER RELEASE Left 10/23/2014   Procedure: LEFT INDEX TRIGGER FINGER RELEASE;  Surgeon: Jolyn Nap, MD;  Location: [place];  Service: Orthopedics;  Laterality: Left;    Current Outpatient Prescriptions  Medication Sig Dispense Refill  . allopurinol (ZYLOPRIM) 100 MG tablet Take 100 mg by mouth every morning.     Marland Kitchen aspirin EC 81 MG tablet Take 81 mg by mouth at bedtime.     . Brinzolamide-Brimonidine (SIMBRINZA) 1-0.2 % SUSP Place 1 drop into both eyes 2 (two) times daily. TAKING SIMBRINZA BRAND NAME    . diltiazem (CARDIZEM CD) 240 MG 24 hr capsule  Take 1 capsule (240 mg total) by mouth daily. 90 capsule 3  . latanoprost (XALATAN) 0.005 % ophthalmic solution Place 1 drop into both eyes at bedtime.  6  . Multiple Vitamins-Minerals (PRESERVISION AREDS PO) Take 1 capsule by mouth every morning.     No current facility-administered medications for this visit.     Physical Exam: Vitals:   03/09/17 1619  BP: 112/60  Pulse: 66  SpO2: 95%  Weight: 161 lb (73 kg)  Height: 5\' 11"  (1.803 m)    GEN- The patient is well appearing, alert and oriented x 3 today.   Head- normocephalic, atraumatic Eyes-  Sclera clear, conjunctiva pink Ears- hearing intact Oropharynx- clear Lungs- Clear to ausculation bilaterally, normal work of breathing Heart- Regular rate and rhythm, no murmurs, rubs or gallops, PMI not laterally displaced GI- soft, NT, ND, + BS Extremities- no clubbing, cyanosis, or edema MS- no deformity  Echo and monitor results reviewed with the patient  Assessment and Plan:  1.  SVT Short RP SVT recently documented. I have discussed ablation as an option again today (refer to Ms Ursuy's recent note).  He again declines Continue current treatment  2. Nocturnal syncope Vagal by history No further workup at th is time  Return to see me in 6 months unless problems arise in the interim  Thompson Grayer MD, [facility] 03/09/2017 4:30 PM

## 2017-03-09 NOTE — Patient Instructions (Signed)

## 2017-09-07 ENCOUNTER — Ambulatory Visit: Payer: Medicare Other | Admitting: Internal Medicine

## 2017-09-14 ENCOUNTER — Encounter: Payer: Self-pay | Admitting: Internal Medicine

## 2017-09-14 ENCOUNTER — Ambulatory Visit (INDEPENDENT_AMBULATORY_CARE_PROVIDER_SITE_OTHER): Payer: Medicare Other | Admitting: Internal Medicine

## 2017-09-14 VITALS — BP 120/58 | HR 47 | Ht 71.0 in | Wt 160.0 lb

## 2017-09-14 DIAGNOSIS — I471 Supraventricular tachycardia: Secondary | ICD-10-CM

## 2017-09-14 NOTE — Progress Notes (Signed)
   PCP: Jonathan Fess, MD Primary Cardiologist: [name] Primary EP: [name] is a 83 y.o. male who presents today for routine electrophysiology followup.  Since last being seen in our clinic, the patient reports doing very well.  No further SVT.   No syncope.   Today, he denies symptoms of palpitations, chest pain, shortness of breath,  lower extremity edema, dizziness, presyncope, or syncope.  The patient is otherwise without complaint today.   Past Medical History:  Diagnosis Date  . Aortic insufficiency    mild  . Arthritis   . Glaucoma   . Gout   . Hearing loss   . HTN (hypertension)    no medication  . Macular degeneration   . Prostate cancer [facility])    s/p surgery with subsequent incontinence  . SVT (supraventricular tachycardia) (HCC)    Past Surgical History:  Procedure Laterality Date  . APPENDECTOMY    . bilateral total hip replacement    . INGUINAL HERNIA REPAIR    . PROSTATECTOMY     with pelvic lymphadenectomy    ROS- all systems are reviewed and negatives except as per HPI above  Current Outpatient Medications  Medication Sig Dispense Refill  . allopurinol (ZYLOPRIM) 100 MG tablet Take 100 mg by mouth every morning.     Marland Kitchen aspirin EC 81 MG tablet Take 81 mg by mouth at bedtime.     . Brinzolamide-Brimonidine (SIMBRINZA) 1-0.2 % SUSP Place 1 drop into both eyes 2 (two) times daily. TAKING SIMBRINZA BRAND NAME    . diltiazem (CARDIZEM CD) 240 MG 24 hr capsule Take 1 capsule (240 mg total) by mouth daily. 90 capsule 3  . latanoprost (XALATAN) 0.005 % ophthalmic solution Place 1 drop into both eyes at bedtime.  6  . Multiple Vitamins-Minerals (PRESERVISION AREDS PO) Take 1 capsule by mouth every morning.     No current facility-administered medications for this visit.     Physical Exam: Vitals:   09/14/17 1226  BP: (!) 120/58  Pulse: (!) 47  SpO2: 98%  Weight: 160 lb (72.6 kg)  Height: 5\' 11"  (1.803 m)    GEN- The patient is well  appearing, alert and oriented x 3 today.   Head- normocephalic, atraumatic Eyes-  Sclera clear, conjunctiva pink Ears- hearing intact Oropharynx- clear Lungs- Clear to ausculation bilaterally, normal work of breathing Heart- bradycardic regular rhythm, no murmurs, rubs or gallops, PMI not laterally displaced GI- soft, NT, ND, + BS Extremities- no clubbing, cyanosis, or edema  EKG tracing ordered today is personally reviewed and shows sinus bradycardia 47 bpm, PR 232 msec, LVH  Assessment and Plan:  1. SVT Documented short RP SVT Not interested in ablation Well controlled No changes  2. Nocturnal syncope Resolved  3. Sinus bradycardia Asymptomatic Would reduce diltiazem should symptoms occur  Return to see me in a year  Jonathan Grayer MD, [facility] 09/14/2017 12:47 PM

## 2017-09-14 NOTE — Patient Instructions (Signed)

## 2018-01-02 ENCOUNTER — Other Ambulatory Visit: Payer: Self-pay | Admitting: Internal Medicine

## 2018-02-09 ENCOUNTER — Telehealth: Payer: Self-pay | Admitting: Cardiology

## 2018-02-09 NOTE — Telephone Encounter (Signed)
Spoke with ophthalmologist at [facility].  Glaucoma noted.  He would like to start him on low-dose timolol.  His heart rate currently is in the 60s at their office..  I thought it would be reasonable to give him atenolol.  If necessary, we can always pull back on the diltiazem from 240-180 or 120 in the future.  He will let us know if he has any symptoms with his bradycardia.  Candee Furbish, MD

## 2018-07-17 ENCOUNTER — Other Ambulatory Visit: Payer: Self-pay | Admitting: Internal Medicine

## 2018-10-02 ENCOUNTER — Other Ambulatory Visit: Payer: Self-pay | Admitting: Internal Medicine

## 2018-12-18 ENCOUNTER — Other Ambulatory Visit: Payer: Self-pay | Admitting: Internal Medicine
